# Patient Record
Sex: Female | Born: 1940 | Race: White | Hispanic: No | Marital: Married | Smoking: Never smoker
Health system: Southern US, Community
[De-identification: ages and names within clinical notes are randomized; demographics above are authoritative.]

## PROBLEM LIST (undated history)

## (undated) DIAGNOSIS — J302 Other seasonal allergic rhinitis: Secondary | ICD-10-CM

---

## 1999-06-02 ENCOUNTER — Encounter: Payer: Self-pay | Admitting: Obstetrics and Gynecology

## 1999-06-02 ENCOUNTER — Encounter: Admission: RE | Admit: 1999-06-02 | Discharge: 1999-06-02 | Payer: Self-pay | Admitting: Obstetrics and Gynecology

## 2000-04-13 ENCOUNTER — Ambulatory Visit (HOSPITAL_COMMUNITY): Admission: RE | Admit: 2000-04-13 | Discharge: 2000-04-13 | Payer: Self-pay | Admitting: Gastroenterology

## 2000-04-13 ENCOUNTER — Encounter (INDEPENDENT_AMBULATORY_CARE_PROVIDER_SITE_OTHER): Payer: Self-pay | Admitting: *Deleted

## 2000-08-01 ENCOUNTER — Encounter: Payer: Self-pay | Admitting: Obstetrics and Gynecology

## 2000-08-01 ENCOUNTER — Encounter: Admission: RE | Admit: 2000-08-01 | Discharge: 2000-08-01 | Payer: Self-pay | Admitting: Obstetrics and Gynecology

## 2002-04-10 ENCOUNTER — Encounter: Payer: Self-pay | Admitting: Obstetrics and Gynecology

## 2002-04-10 ENCOUNTER — Encounter: Admission: RE | Admit: 2002-04-10 | Discharge: 2002-04-10 | Payer: Self-pay | Admitting: Obstetrics and Gynecology

## 2003-05-12 ENCOUNTER — Encounter: Admission: RE | Admit: 2003-05-12 | Discharge: 2003-05-12 | Payer: Self-pay | Admitting: Internal Medicine

## 2004-05-04 ENCOUNTER — Other Ambulatory Visit: Admission: RE | Admit: 2004-05-04 | Discharge: 2004-05-04 | Payer: Self-pay | Admitting: Obstetrics and Gynecology

## 2004-06-21 ENCOUNTER — Encounter: Admission: RE | Admit: 2004-06-21 | Discharge: 2004-06-21 | Payer: Self-pay | Admitting: Internal Medicine

## 2005-05-10 ENCOUNTER — Other Ambulatory Visit: Admission: RE | Admit: 2005-05-10 | Discharge: 2005-05-10 | Payer: Self-pay | Admitting: Obstetrics and Gynecology

## 2006-04-24 ENCOUNTER — Encounter: Admission: RE | Admit: 2006-04-24 | Discharge: 2006-04-24 | Payer: Self-pay | Admitting: Obstetrics and Gynecology

## 2006-05-14 ENCOUNTER — Other Ambulatory Visit: Admission: RE | Admit: 2006-05-14 | Discharge: 2006-05-14 | Payer: Self-pay | Admitting: Obstetrics and Gynecology

## 2007-05-07 ENCOUNTER — Encounter: Admission: RE | Admit: 2007-05-07 | Discharge: 2007-05-07 | Payer: Self-pay | Admitting: Obstetrics and Gynecology

## 2008-05-07 ENCOUNTER — Encounter: Admission: RE | Admit: 2008-05-07 | Discharge: 2008-05-07 | Payer: Self-pay | Admitting: Obstetrics and Gynecology

## 2008-05-15 ENCOUNTER — Other Ambulatory Visit: Admission: RE | Admit: 2008-05-15 | Discharge: 2008-05-15 | Payer: Self-pay | Admitting: Obstetrics and Gynecology

## 2009-06-04 ENCOUNTER — Encounter: Admission: RE | Admit: 2009-06-04 | Discharge: 2009-06-04 | Payer: Self-pay | Admitting: Obstetrics and Gynecology

## 2009-06-09 ENCOUNTER — Other Ambulatory Visit: Admission: RE | Admit: 2009-06-09 | Discharge: 2009-06-09 | Payer: Self-pay | Admitting: Obstetrics and Gynecology

## 2010-09-20 ENCOUNTER — Other Ambulatory Visit: Payer: Self-pay | Admitting: Obstetrics and Gynecology

## 2010-09-20 DIAGNOSIS — Z1231 Encounter for screening mammogram for malignant neoplasm of breast: Secondary | ICD-10-CM

## 2010-09-28 ENCOUNTER — Ambulatory Visit
Admission: RE | Admit: 2010-09-28 | Discharge: 2010-09-28 | Disposition: A | Discharge status: Home / Self Care | Payer: Medicare Other | Source: Ambulatory Visit | Attending: Obstetrics and Gynecology | Admitting: Obstetrics and Gynecology

## 2010-09-28 DIAGNOSIS — Z1231 Encounter for screening mammogram for malignant neoplasm of breast: Secondary | ICD-10-CM

## 2010-10-14 NOTE — Procedures (Signed)
[place]  Patient:    Heather Phillips, Heather Phillips                        MRN: [id number] Proc. Date: 04/13/00 Adm. Date:  [id number] Attending:  Nelda Marseille CC:         Lennon Alstrom. Felipa Eth, M.D.   Procedure Report  PROCEDURE:  Colonoscopy with hot biopsy.  INDICATION:  Patient with a history of colon polyps, overdue for colonic screening.  Consent was signed after risks, benefits, methods, and options thoroughly discussed.  MEDICATIONS:  Demerol 65 mg, Versed 6 mg.  DESCRIPTION OF PROCEDURE:  Rectal inspection was pertinent for external hemorrhoids, small.  Digital exam was negative.  Pediatric video colonoscope was inserted and fairly easily despite a tortuous colon able to advance around the colon to the cecum.  This did require some left lower quadrant pressure but no position changes.  On insertion, some left-sided diverticula were seen. The cecum was identified by the appendiceal orifice and the ileocecal valve. In fact, the scope was inserted a short way into the terminal ileum, which was normal.  Photo documentation was obtained.  The scope was slowly withdrawn. The prep was adequate.  The scope was slowly withdrawn.  There was minimal liquid stool that required washing and suctioning.  On slow withdrawal, the cecum, ascending, and transverse were normal.  The scope was drawn around the left side of the colon, where left-sided diverticula were seen.  In the proximal sigmoid, a 2 mm polyp was seen and was cold and then hot biopsied x 1.  No other abnormalities were seen as we slowly withdrew back to the rectum.  Once back to the rectum, the scope was retroflexed, pertinent for some internal hemorrhoids.  Anorectal pullthrough confirmed the above.  The scope was reinserted a short way up the sigmoid, air was suctioned, and the scope removed.  The patient tolerated the procedure well.  There was no obvious immediate complication.  ENDOSCOPIC  DIAGNOSES: 1. Small internal-external hemorrhoids with an anal papilla. 2. Left diverticula and tortuosity. 3. Proximal sigmoid small polyp, cold and then hot biopsied. 4. Otherwise within normal limits to the terminal ileum.  PLAN:  Await pathology but probably recheck in five years.  One week customary postpolypectomy instructions.  I would be happy to see back p.r.n., otherwise return care to Dr. Felipa Eth for the customary health care maintenance to include yearly rectals and guaiacs. DD:  04/13/00 TD:  04/13/00 Job: [id number]

## 2011-05-15 ENCOUNTER — Other Ambulatory Visit: Payer: Self-pay | Admitting: Internal Medicine

## 2011-05-15 DIAGNOSIS — R140 Abdominal distension (gaseous): Secondary | ICD-10-CM

## 2011-05-16 ENCOUNTER — Ambulatory Visit
Admission: RE | Admit: 2011-05-16 | Discharge: 2011-05-16 | Disposition: A | Discharge status: Home / Self Care | Payer: Medicare Other | Source: Ambulatory Visit | Attending: Internal Medicine | Admitting: Internal Medicine

## 2011-05-16 DIAGNOSIS — R140 Abdominal distension (gaseous): Secondary | ICD-10-CM

## 2011-07-25 ENCOUNTER — Other Ambulatory Visit: Payer: Self-pay | Admitting: Internal Medicine

## 2011-07-25 DIAGNOSIS — N281 Cyst of kidney, acquired: Secondary | ICD-10-CM

## 2011-09-08 ENCOUNTER — Other Ambulatory Visit: Payer: Self-pay | Admitting: Obstetrics and Gynecology

## 2011-09-08 ENCOUNTER — Other Ambulatory Visit (HOSPITAL_COMMUNITY)
Admission: RE | Admit: 2011-09-08 | Discharge: 2011-09-08 | Disposition: A | Discharge status: Home / Self Care | Payer: Medicare Other | Source: Ambulatory Visit | Attending: Obstetrics and Gynecology | Admitting: Obstetrics and Gynecology

## 2011-09-08 DIAGNOSIS — Z124 Encounter for screening for malignant neoplasm of cervix: Secondary | ICD-10-CM | POA: Insufficient documentation

## 2011-10-25 ENCOUNTER — Other Ambulatory Visit: Payer: Self-pay | Admitting: Obstetrics and Gynecology

## 2011-10-25 DIAGNOSIS — Z1231 Encounter for screening mammogram for malignant neoplasm of breast: Secondary | ICD-10-CM

## 2011-11-08 ENCOUNTER — Ambulatory Visit: Payer: Medicare Other

## 2011-11-13 ENCOUNTER — Ambulatory Visit
Admission: RE | Admit: 2011-11-13 | Discharge: 2011-11-13 | Disposition: A | Discharge status: Home / Self Care | Payer: Medicare Other | Source: Ambulatory Visit | Attending: Obstetrics and Gynecology | Admitting: Obstetrics and Gynecology

## 2011-11-13 DIAGNOSIS — Z1231 Encounter for screening mammogram for malignant neoplasm of breast: Secondary | ICD-10-CM

## 2012-01-16 ENCOUNTER — Other Ambulatory Visit: Payer: Medicare Other

## 2012-02-20 ENCOUNTER — Ambulatory Visit
Admission: RE | Admit: 2012-02-20 | Discharge: 2012-02-20 | Disposition: A | Discharge status: Home / Self Care | Payer: Medicare Other | Source: Ambulatory Visit | Attending: Internal Medicine | Admitting: Internal Medicine

## 2012-02-20 DIAGNOSIS — N281 Cyst of kidney, acquired: Secondary | ICD-10-CM

## 2012-06-13 ENCOUNTER — Other Ambulatory Visit: Payer: Self-pay | Admitting: Gastroenterology

## 2013-07-10 ENCOUNTER — Other Ambulatory Visit: Payer: Self-pay

## 2013-07-10 DIAGNOSIS — Z1231 Encounter for screening mammogram for malignant neoplasm of breast: Secondary | ICD-10-CM

## 2013-07-30 ENCOUNTER — Ambulatory Visit
Admission: RE | Admit: 2013-07-30 | Discharge: 2013-07-30 | Disposition: A | Discharge status: Home / Self Care | Payer: Self-pay | Source: Ambulatory Visit

## 2013-07-30 DIAGNOSIS — Z1231 Encounter for screening mammogram for malignant neoplasm of breast: Secondary | ICD-10-CM

## 2014-09-15 ENCOUNTER — Other Ambulatory Visit: Payer: Self-pay

## 2014-09-15 DIAGNOSIS — Z1231 Encounter for screening mammogram for malignant neoplasm of breast: Secondary | ICD-10-CM

## 2014-09-19 ENCOUNTER — Emergency Department (HOSPITAL_COMMUNITY)
Admission: EM | Admit: 2014-09-19 | Discharge: 2014-09-19 | Disposition: A | Discharge status: Home / Self Care | Payer: Medicare Other | Attending: Emergency Medicine | Admitting: Emergency Medicine

## 2014-09-19 ENCOUNTER — Encounter (HOSPITAL_COMMUNITY): Payer: Self-pay | Admitting: Nurse Practitioner

## 2014-09-19 ENCOUNTER — Emergency Department (HOSPITAL_COMMUNITY): Payer: Medicare Other

## 2014-09-19 DIAGNOSIS — Z7952 Long term (current) use of systemic steroids: Secondary | ICD-10-CM | POA: Diagnosis not present

## 2014-09-19 DIAGNOSIS — Z79899 Other long term (current) drug therapy: Secondary | ICD-10-CM | POA: Insufficient documentation

## 2014-09-19 DIAGNOSIS — M25569 Pain in unspecified knee: Secondary | ICD-10-CM

## 2014-09-19 DIAGNOSIS — M25562 Pain in left knee: Secondary | ICD-10-CM | POA: Insufficient documentation

## 2014-09-19 DIAGNOSIS — Z880 Allergy status to penicillin: Secondary | ICD-10-CM | POA: Insufficient documentation

## 2014-09-19 DIAGNOSIS — M79662 Pain in left lower leg: Secondary | ICD-10-CM | POA: Diagnosis present

## 2014-09-19 HISTORY — DX: Other seasonal allergic rhinitis: J30.2

## 2014-09-19 MED ORDER — HYDROCODONE-ACETAMINOPHEN 5-325 MG PO TABS: 1.0000 | ORAL_TABLET | Freq: Four times a day (QID) | ORAL | Status: DC | PRN

## 2014-09-19 NOTE — ED Notes (Signed)
Patient has ice to calf, patient states ice relieves the pain. Patient rates pain 2/10 while witting but 10/10 when ambulating.

## 2014-09-19 NOTE — Discharge Instructions (Signed)
Follow-up with orthopedics or your primary care doctor. Keep your knee elevated, continue to ice it, alternate ibuprofen and Tylenol as needed for pain every 3-4 hours. You may also use Vicodin as needed for pain. Return to the ER if any severe swelling, redness, warmth, difficulty moving her knee, high fever, numbness, weakness.  Knee Pain The knee is the complex joint between your thigh and your lower leg. It is made up of bones, tendons, ligaments, and cartilage. The bones that make up the knee are:  The femur in the thigh.  The tibia and fibula in the lower leg.  The patella or kneecap riding in the groove on the lower femur. CAUSES  Knee pain is a common complaint with many causes. A few of these causes are:  Injury, such as:  A ruptured ligament or tendon injury.  Torn cartilage.  Medical conditions, such as:  Gout  Arthritis  Infections  Overuse, over training, or overdoing a physical activity. Knee pain can be minor or severe. Knee pain can accompany debilitating injury. Minor knee problems often respond well to self-care measures or get well on their own. More serious injuries may need medical intervention or even surgery. SYMPTOMS The knee is complex. Symptoms of knee problems can vary widely. Some of the problems are:  Pain with movement and weight bearing.  Swelling and tenderness.  Buckling of the knee.  Inability to straighten or extend your knee.  Your knee locks and you cannot straighten it.  Warmth and redness with pain and fever.  Deformity or dislocation of the kneecap. DIAGNOSIS  Determining what is wrong may be very straight forward such as when there is an injury. It can also be challenging because of the complexity of the knee. Tests to make a diagnosis may include:  Your caregiver taking a history and doing a physical exam.  Routine X-rays can be used to rule out other problems. X-rays will not reveal a cartilage tear. Some injuries of the  knee can be diagnosed by:  Arthroscopy a surgical technique by which a small video camera is inserted through tiny incisions on the sides of the knee. This procedure is used to examine and repair internal knee joint problems. Tiny instruments can be used during arthroscopy to repair the torn knee cartilage (meniscus).  Arthrography is a radiology technique. A contrast liquid is directly injected into the knee joint. Internal structures of the knee joint then become visible on X-ray film.  An MRI scan is a non X-ray radiology procedure in which magnetic fields and a computer produce two- or three-dimensional images of the inside of the knee. Cartilage tears are often visible using an MRI scanner. MRI scans have largely replaced arthrography in diagnosing cartilage tears of the knee.  Blood work.  Examination of the fluid that helps to lubricate the knee joint (synovial fluid). This is done by taking a sample out using a needle and a syringe. TREATMENT The treatment of knee problems depends on the cause. Some of these treatments are:  Depending on the injury, proper casting, splinting, surgery, or physical therapy care will be needed.  Give yourself adequate recovery time. Do not overuse your joints. If you begin to get sore during workout routines, back off. Slow down or do fewer repetitions.  For repetitive activities such as cycling or running, maintain your strength and nutrition.  Alternate muscle groups. For example, if you are a weight lifter, work the upper body on one day and the lower body the  next.  Either tight or weak muscles do not give the proper support for your knee. Tight or weak muscles do not absorb the stress placed on the knee joint. Keep the muscles surrounding the knee strong.  Take care of mechanical problems.  If you have flat feet, orthotics or special shoes may help. See your caregiver if you need help.  Arch supports, sometimes with wedges on the inner or outer  aspect of the heel, can help. These can shift pressure away from the side of the knee most bothered by osteoarthritis.  A brace called an "unloader" brace also may be used to help ease the pressure on the most arthritic side of the knee.  If your caregiver has prescribed crutches, braces, wraps or ice, use as directed. The acronym for this is PRICE. This means protection, rest, ice, compression, and elevation.  Nonsteroidal anti-inflammatory drugs (NSAIDs), can help relieve pain. But if taken immediately after an injury, they may actually increase swelling. Take NSAIDs with food in your stomach. Stop them if you develop stomach problems. Do not take these if you have a history of ulcers, stomach pain, or bleeding from the bowel. Do not take without your caregiver's approval if you have problems with fluid retention, heart failure, or kidney problems.  For ongoing knee problems, physical therapy may be helpful.  Glucosamine and chondroitin are over-the-counter dietary supplements. Both may help relieve the pain of osteoarthritis in the knee. These medicines are different from the usual anti-inflammatory drugs. Glucosamine may decrease the rate of cartilage destruction.  Injections of a corticosteroid drug into your knee joint may help reduce the symptoms of an arthritis flare-up. They may provide pain relief that lasts a few months. You may have to wait a few months between injections. The injections do have a small increased risk of infection, water retention, and elevated blood sugar levels.  Hyaluronic acid injected into damaged joints may ease pain and provide lubrication. These injections may work by reducing inflammation. A series of shots may give relief for as long as 6 months.  Topical painkillers. Applying certain ointments to your skin may help relieve the pain and stiffness of osteoarthritis. Ask your pharmacist for suggestions. Many over the-counter products are approved for temporary  relief of arthritis pain.  In some countries, doctors often prescribe topical NSAIDs for relief of chronic conditions such as arthritis and tendinitis. A review of treatment with NSAID creams found that they worked as well as oral medications but without the serious side effects. PREVENTION  Maintain a healthy weight. Extra pounds put more strain on your joints.  Get strong, stay limber. Weak muscles are a common cause of knee injuries. Stretching is important. Include flexibility exercises in your workouts.  Be smart about exercise. If you have osteoarthritis, chronic knee pain or recurring injuries, you may need to change the way you exercise. This does not mean you have to stop being active. If your knees ache after jogging or playing basketball, consider switching to swimming, water aerobics, or other low-impact activities, at least for a few days a week. Sometimes limiting high-impact activities will provide relief.  Make sure your shoes fit well. Choose footwear that is right for your sport.  Protect your knees. Use the proper gear for knee-sensitive activities. Use kneepads when playing volleyball or laying carpet. Buckle your seat belt every time you drive. Most shattered kneecaps occur in car accidents.  Rest when you are tired. SEEK MEDICAL CARE IF:  You have knee pain  that is continual and does not seem to be getting better.  SEEK IMMEDIATE MEDICAL CARE IF:  Your knee joint feels hot to the touch and you have a high fever. MAKE SURE YOU:   Understand these instructions.  Will watch your condition.  Will get help right away if you are not doing well or get worse. Document Released: 03/12/2007 Document Revised: 08/07/2011 Document Reviewed: 03/12/2007 [facility] Patient Information 2015 [place], [place]. This information is not intended to replace advice given to you by your health care provider. Make sure you discuss any questions you have with your health care provider.

## 2014-09-19 NOTE — ED Notes (Signed)
Declined W/C at D/C and was escorted to lobby by RN. 

## 2014-09-19 NOTE — ED Notes (Signed)
Pulled prescription  pad for PA Ladona MowJoe Mintz

## 2014-09-19 NOTE — Progress Notes (Signed)
VASCULAR LAB PRELIMINARY  PRELIMINARY  PRELIMINARY  PRELIMINARY  Left lower extremity venous Doppler completed.    Preliminary report:  There is no DVT, SVT, or Baker's cyst noted in the left lower extremity.   Kalisi Bevill, RVT 09/19/2014, 2:39 PM

## 2014-09-19 NOTE — ED Provider Notes (Signed)
Patient presented to the ER with leg pain. Patient experiencing pain behind the left knee and left upper calf. Pain significantly worsens if she stands or walks. She denies injury.  Face to face Exam: HEENT - PERRLA Lungs - CTAB Heart - RRR, no M/R/G Abd - S/NT/ND Neuro - alert, oriented x3 Musculoskeletal - no calf tenderness, no Homans sign, no venous cords. Normal dorsalis pedal pulse. Tenderness in the popliteal fossa without mass or sign of aneurysm. Skin - no erythema or skin changes  Plan: Agent with pain primarily behind the left knee but there is some tenderness in the left upper calf without any obvious signs of DVT. Will x-ray the knee and obtained venous duplex, likely secondary to strain, osteoarthritis or possibly Baker's cyst.  Gilda Creasehristopher J Pollina, MD 09/19/14 1428

## 2014-09-19 NOTE — ED Notes (Signed)
She reports onset of L posterior leg pain after moving from sitting to standing position. Every time she stands and bears weight she has severe pain. The pain resolves with rest. She denies any injuries. She applied ice with no relief. Cms intact

## 2014-09-19 NOTE — ED Provider Notes (Signed)
CSN: [id number]     Arrival date & time 09/19/14  1259 History   This chart was scribed for non-physician practitioner, Jinny Sanders, PA-C working with Gilda Crease, MD, by Abel Presto, ED Scribe. This patient was seen in room TR08C/TR08C and the patient's care was started at 1:18 PM.      Chief Complaint  Patient presents with  . Leg Pain    Patient is a 74 y.o. female presenting with leg pain. The history is provided by the patient. No language interpreter was used.  Leg Pain  HPI Comments: Shanica Castellanos is a 74 y.o. female who presents to the Emergency Department complaining of left posterior knee pain with onset this morning. Pt states pain begun while she was at a conference. She reports pain with bearing weight and ambulation, causing her to walk with a limp. She states some pain with ROM.  Pt has had similar pain in the past but states current pain is much worse. She states she typically takes ASA for relief but notes she forgot to do so today. Pt has tried icing for relief. Pt denies any known injury, numbness, and weakness.   Past Medical History  Diagnosis Date  . Seasonal allergies    History reviewed. No pertinent past surgical history. History reviewed. No pertinent family history. History  Substance Use Topics  . Smoking status: Never Smoker   . Smokeless tobacco: Not on file  . Alcohol Use: No   OB History    No data available     Review of Systems  Musculoskeletal: Positive for myalgias.  Neurological: Negative for weakness and numbness.      Allergies  Celebrex; Penicillins; and Sulfur  Home Medications   Prior to Admission medications   Medication Sig Start Date End Date Taking? Authorizing Provider  Apoaequorin (PREVAGEN PO) Take 1 capsule by mouth daily.   Yes Historical Provider, MD  cholecalciferol (VITAMIN D) 1000 UNITS tablet Take 1,000 Units by mouth daily.   Yes Historical Provider, MD  fluticasone (FLONASE) 50 MCG/ACT nasal spray  Place 2 sprays into both nostrils daily.   Yes Historical Provider, MD  loratadine-pseudoephedrine (CLARITIN-D 24-HOUR) 10-240 MG per 24 hr tablet Take 1 tablet by mouth daily.   Yes Historical Provider, MD  montelukast (SINGULAIR) 10 MG tablet Take 10 mg by mouth at bedtime.   Yes Historical Provider, MD  vitamin C (ASCORBIC ACID) 250 MG tablet Take 250 mg by mouth daily.   Yes Historical Provider, MD  HYDROcodone-acetaminophen (NORCO/VICODIN) 5-325 MG per tablet Take 1-2 tablets by mouth every 6 (six) hours as needed. 09/19/14   Ladona Mow, PA-C   BP 109/68 mmHg  Pulse 76  Temp(Src) 98.5 F (36.9 C) (Oral)  Resp 18  SpO2 100% Physical Exam  Constitutional: She is oriented to person, place, and time. She appears well-developed and well-nourished.  HENT:  Head: Normocephalic.  Eyes: Conjunctivae are normal.  Neck: Normal range of motion. Neck supple.  Cardiovascular:  Pulses:      Dorsalis pedis pulses are 2+ on the right side, and 2+ on the left side.  DP and popliteal pulses intact  Pulmonary/Chest: Effort normal.  Musculoskeletal: Normal range of motion.       Left knee: She exhibits normal range of motion, no effusion and no erythema.  Left leg: no erythema, edema or warmth. No obvious effusion; full ROM of knee. Small mild amount of swelling posterior aspect of knee 5/5 strength  Neurological: She is alert and oriented  to person, place, and time.  Skin: Skin is warm and dry.  Psychiatric: She has a normal mood and affect. Her behavior is normal.  Nursing note and vitals reviewed.   ED Course  Procedures (including critical care time) DIAGNOSTIC STUDIES: Oxygen Saturation is 99% on room air, normal by my interpretation.    COORDINATION OF CARE: 1:27 PM Discussed treatment plan with patient at beside, the patient agrees with the plan and has no further questions at this time.   Labs Review Labs Reviewed - No data to display  Imaging Review Dg Knee Complete 4 Views  Left  09/19/2014   CLINICAL DATA:  Posterior LEFT knee pain. Acute onset. Heaviness in the posterior knee. Inability to bear weight on the LEFT knee.  EXAM: LEFT KNEE - COMPLETE 4+ VIEW  COMPARISON:  None.  FINDINGS: No fracture or acute osseous abnormality. No large effusion. Mild patellofemoral and lateral compartment osteoarthritis. Central osteophyte is present in the weight-bearing lateral femoral condyle articular surface. Minimal medial compartment osteoarthritis.  The alignment of the knee is anatomic.  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   Electronically Signed   By: Andreas NewportGeoffrey  Lamke M.D.   On: 09/19/2014 14:39     EKG Interpretation None      MDM   Final diagnoses:  Knee pain  Knee pain, acute, left   Patient here with posterior knee pain, which is worse today than usual. Patient states she typically has this pain, was worse today after standing up abruptly. Patient describing pain with range of motion. On exam there is no acute abnormalities. There is no calf tenderness, Homans sign negative. Mild tenderness noted to popliteal region without signs of DVT, mass or aneurysm. Discomfort patient is experiencing is likely musculoskeletal in nature. No concern for septic arthritis.  Based on patient's age and vague complaint, will follow with radiographs and with venous duplex of her lower extremity.  X-rays remarkable for some tricompartmental degenerative changes. Venous duplex without evidence of DVT, SVT or Baker's cyst.  Patient hemodynamically stable and in no acute distress. Patient will be discharged at this time. RICE therapy encouraged, patient given knee immobilizer and prescription for walker to use at home. Strongly encouraged patient to follow up with orthopedics or her primary care physician. Discussed return precautions with patient, patient and her husband verbalize understanding and agreement of this plan. Encouraged patient to  call or return to the ER with any worsening symptoms or should she have any questions or concerns.  I personally performed the services described in this documentation, which was scribed in my presence. The recorded information has been reviewed and is accurate.  BP 109/68 mmHg  Pulse 76  Temp(Src) 98.5 F (36.9 C) (Oral)  Resp 18  SpO2 100%  Signed,  Ladona MowJoe Kamariah Fruchter, PA-C 5:15 PM  Patient seen and discussed with Dr. Jaci Carrelhristopher Pollina, M.D.     Ladona MowJoe Diandra Cimini, PA-C 09/19/14 1715  Gilda Creasehristopher J Pollina, MD 09/20/14 [phone]

## 2014-09-25 ENCOUNTER — Ambulatory Visit: Payer: Self-pay

## 2014-10-12 ENCOUNTER — Ambulatory Visit
Admission: RE | Admit: 2014-10-12 | Discharge: 2014-10-12 | Disposition: A | Discharge status: Home / Self Care | Payer: Medicare Other | Source: Ambulatory Visit

## 2014-10-12 DIAGNOSIS — Z1231 Encounter for screening mammogram for malignant neoplasm of breast: Secondary | ICD-10-CM

## 2014-11-27 ENCOUNTER — Other Ambulatory Visit: Payer: Self-pay | Admitting: Orthopedic Surgery

## 2014-11-27 DIAGNOSIS — M25561 Pain in right knee: Secondary | ICD-10-CM

## 2014-12-15 ENCOUNTER — Other Ambulatory Visit: Payer: Self-pay | Admitting: Orthopedic Surgery

## 2014-12-15 ENCOUNTER — Ambulatory Visit
Admission: RE | Admit: 2014-12-15 | Discharge: 2014-12-15 | Disposition: A | Discharge status: Home / Self Care | Payer: Medicare Other | Source: Ambulatory Visit | Attending: Orthopedic Surgery | Admitting: Orthopedic Surgery

## 2014-12-15 DIAGNOSIS — M25561 Pain in right knee: Secondary | ICD-10-CM

## 2014-12-16 ENCOUNTER — Other Ambulatory Visit: Payer: Self-pay | Admitting: Orthopedic Surgery

## 2014-12-16 DIAGNOSIS — M25562 Pain in left knee: Secondary | ICD-10-CM

## 2015-03-03 ENCOUNTER — Ambulatory Visit (INDEPENDENT_AMBULATORY_CARE_PROVIDER_SITE_OTHER): Payer: Medicare Other | Admitting: Sports Medicine

## 2015-03-03 ENCOUNTER — Encounter: Payer: Self-pay | Admitting: Sports Medicine

## 2015-03-03 VITALS — Ht 61.5 in | Wt 135.0 lb

## 2015-03-03 DIAGNOSIS — M1711 Unilateral primary osteoarthritis, right knee: Secondary | ICD-10-CM | POA: Diagnosis not present

## 2015-03-03 NOTE — Progress Notes (Signed)
Heather Phillips - 75 y.o. female MRN [id number]  Date of birth: Sep 17, 1940  CC: Left knee pain  SUBJECTIVE:   HPI  Acute onset of Left knee pain on 09/19/2014: - Acute injury. It worsened through the day. No twisting just began hurting suddenly - Patient was seen at [facility] ortho by Dr. August Saucer in the ensuing few weeks.  -- 2 aspirations and cortisone prior to trip to [place] -- aspirations/cortisone injections did help her through her vacation. - Currently constant 2-3/10 pain, unless she overdoes it.   - Going down stairs currently hurts her the most. Feels more unstable.  No frank giving out.  - NO locking or swelling over the last few months.   - Takes IBU rarely, currently <1/week -  Currently doing spinning and some exercise machines at the [facility]. Wonders what she can and can't do.  - She did hike this  point RoS negative other than that listed in HPI in regards to orthopedic complaint.   HISTORY: Past Medical, Surgical, Social, and Family History Reviewed & Updated per EMR.  Pertinent Historical finding: no   OBJECTIVE: Ht 5' 1.5" (1.562 m)  Wt 135 lb (61.236 kg)  BMI 25.10 kg/m2  Physical Exam  Calm, no distress Non-labored breathing.   Knee:  left Normal to inspection with no erythema or effusion or obvious bony abnormalities. Tender posteriomedial joint line.  Complete ROM normal in flexion and extension and lower leg rotation. Pain with terminal flexion.   Ligaments with solid consistent endpoints including ACL, PCL, LCL, MCL. Negative Mcmurray's and provocative meniscal tests, including thessaly Non painful patellar compression. Patellar and quadriceps tendons unremarkable. Hamstring and quadriceps strength is normal.  Imaging: [place] image of the left knee in both long and short axis obtained. There is a mild effusion in the suprapatellar pouch.  QT & PT intact and with a normal fibrillar appearance. Left LM does have a small linear hypoechoic disruption with minimal surrounding fluid. The left MM shows significant surrounding fluid, several hypoechoic lines, and buldging out of the medial joint space.  These findings, in addition to her exam and MRI suggest degenerative changes.  The effusion does suggest intraarticular activity.    MEDICATIONS, LABS & OTHER ORDERS: Previous Medications   APOAEQUORIN (PREVAGEN PO)    Take 1 capsule by mouth daily.   CHOLECALCIFEROL (VITAMIN D) 1000 UNITS TABLET    Take 1,000 Units by mouth daily.   FLUTICASONE (FLONASE) 50 MCG/ACT NASAL SPRAY    Place 2 sprays into both nostrils daily.   HYDROCODONE-ACETAMINOPHEN (NORCO/VICODIN) 5-325 MG PER TABLET    Take 1-2 tablets by mouth every 6 (six) hours as needed.   IBUPROFEN (ADVIL,MOTRIN) 200 MG TABLET    Take 200 mg by mouth every 6 (six) hours as needed.  LORATADINE-PSEUDOEPHEDRINE (CLARITIN-D 24-HOUR) 10-240 MG PER 24 HR TABLET    Take 1 tablet by mouth daily.   MONTELUKAST (SINGULAIR) 10 MG TABLET    Take 10 mg by mouth at bedtime.   VITAMIN C (ASCORBIC ACID) 250 MG TABLET    Take 250 mg by mouth daily.   Modified Medications   No medications on file   New Prescriptions   No medications on file   Discontinued Medications   No medications  on file  No orders of the defined types were placed in this encounter.   ASSESSMENT & PLAN: See problem based charting & AVS for pt instructions.

## 2015-03-04 DIAGNOSIS — M1712 Unilateral primary osteoarthritis, left knee: Secondary | ICD-10-CM | POA: Insufficient documentation

## 2015-03-04 NOTE — Assessment & Plan Note (Addendum)
Right knee DJD, exacerbated in 08/2014.  This was visualized with MRI in 11/2014 and today with u/s.  She has a right posterior medial horn tear. Doing much better now and is able to go on hikes.  NO locking, catching, or giving away.   - Discussed best exercises for knees.   - Continue PRN NSAIDs, which she rarely needs.  - She will enroll in the OA study which will determine if she'll be wearing a brace.  - f/u next week to meet with PT from [facility].

## 2015-09-06 ENCOUNTER — Encounter: Payer: Self-pay | Admitting: Sports Medicine

## 2015-09-06 ENCOUNTER — Ambulatory Visit (INDEPENDENT_AMBULATORY_CARE_PROVIDER_SITE_OTHER): Payer: Medicare Other | Admitting: Sports Medicine

## 2015-09-06 VITALS — BP 139/74 | HR 76 | Ht 61.0 in | Wt 135.0 lb

## 2015-09-06 DIAGNOSIS — M25561 Pain in right knee: Secondary | ICD-10-CM | POA: Diagnosis not present

## 2015-09-06 DIAGNOSIS — M1712 Unilateral primary osteoarthritis, left knee: Secondary | ICD-10-CM | POA: Diagnosis not present

## 2015-09-06 NOTE — Progress Notes (Signed)
Patient ID: Heather Phillips, female   DOB: 11/09/1940, 75 y.o.   MRN: [id number]  CC: Patient returns with 2 weeks of right knee pain  She has MRI documented tricompartmental arthritis of the left knee She responded exceptionally well to the home PT program from the knee study Also uses her knee compression sleeve This reduced her pain to 0 to 1 on most days Has resumed a walking program  Now with 2 weeks of RT knee pain Had started a more vigorous amount of walking Pain hurts now at night as well as with going up or down stairs  Past Hx Never smoked  ROS No swelling noted No locking or giving way  Exam WD, WN WF in NAD BP 139/74 mmHg  Pulse 76  Ht 5\' 1"  (1.549 m)  Wt 135 lb (61.236 kg)  BMI 25.52 kg/m2  Left knee with no obvious effusion Now with good motion - full extension and 135 of flexion  Rt knee with no significant effusion Good motion Ligaments stable Some mild PF pain with motion and compression  US RT knee No significant effusion Some degenerative changes at meniscus medial but not much swelling Tendons intact  Note left knee shows much smaller effusion

## 2015-10-03 DIAGNOSIS — M25561 Pain in right knee: Secondary | ICD-10-CM | POA: Insufficient documentation

## 2015-10-03 NOTE — Assessment & Plan Note (Signed)
Reassured that this knee looks better  Will have her use compression  HEP as per PT training  Reck 2 mos prn

## 2015-10-03 NOTE — Assessment & Plan Note (Signed)
She has done well with HEP Compression as needed  Cont this

## 2015-11-16 ENCOUNTER — Encounter: Payer: Self-pay | Admitting: Sports Medicine

## 2015-11-16 ENCOUNTER — Ambulatory Visit (INDEPENDENT_AMBULATORY_CARE_PROVIDER_SITE_OTHER): Payer: Medicare Other | Admitting: Sports Medicine

## 2015-11-16 VITALS — BP 129/91 | HR 84 | Ht 61.0 in | Wt 135.0 lb

## 2015-11-16 DIAGNOSIS — M25561 Pain in right knee: Secondary | ICD-10-CM

## 2015-11-16 DIAGNOSIS — M1712 Unilateral primary osteoarthritis, left knee: Secondary | ICD-10-CM

## 2015-11-16 NOTE — Assessment & Plan Note (Signed)
Response was excellent to HEP Less changes of OA Excellent ROM  Prn reck

## 2015-11-16 NOTE — Progress Notes (Signed)
Patient ID: Heather Phillips, female   DOB: 11/09/1940, 75 y.o.   MRN: [id number]  F/u of RT knee OA/ Pain  Patient followed protocol for HEP and compression Stated pain is 95% resolved On bad days only 1 or 2 level No more swelling Able to walk and active on recent trip  Lt Knee Known significant OA on MRI Continues to do well  ROS No locking No giving way No other joint swelling  PEXAM  Pleasant in NAD BP 129/91 mmHg  Pulse 84  Ht 5\' 1"  (1.549 m)  Wt 135 lb (61.236 kg)  BMI 25.52 kg/m2  RT Knee: Normal to inspection with no erythema or effusion or obvious bony abnormalities. Palpation normal with no warmth or joint line tenderness or patellar tenderness or condyle tenderness. ROM normal in flexion and extension and lower leg rotation. Note she can get to -2 and 160 deg of knee flexion!  Ligaments with solid consistent endpoints including ACL, PCL, LCL, MCL. Negative Mcmurray's and provocative meniscal tests. Non painful patellar compression. She does have patellar creptitation Patellar and quadriceps tendons unremarkable. Hamstring and quadriceps strength is normal.  LT knee Exam is similar to RT although more visible changes of OA Flexion is still up to 145 to 150 deg and _5 of extension

## 2015-11-16 NOTE — Assessment & Plan Note (Signed)
Cont on HEP and compression program as needed  Reck 1 yr

## 2016-03-23 ENCOUNTER — Other Ambulatory Visit: Payer: Self-pay | Admitting: Internal Medicine

## 2016-03-23 DIAGNOSIS — Z1231 Encounter for screening mammogram for malignant neoplasm of breast: Secondary | ICD-10-CM

## 2016-05-03 ENCOUNTER — Ambulatory Visit
Admission: RE | Admit: 2016-05-03 | Discharge: 2016-05-03 | Disposition: A | Discharge status: Home / Self Care | Payer: Medicare Other | Source: Ambulatory Visit | Attending: Internal Medicine | Admitting: Internal Medicine

## 2016-05-03 DIAGNOSIS — Z1231 Encounter for screening mammogram for malignant neoplasm of breast: Secondary | ICD-10-CM

## 2017-06-11 ENCOUNTER — Other Ambulatory Visit: Payer: Self-pay | Admitting: Internal Medicine

## 2017-06-11 DIAGNOSIS — Z1231 Encounter for screening mammogram for malignant neoplasm of breast: Secondary | ICD-10-CM

## 2017-07-10 ENCOUNTER — Ambulatory Visit
Admission: RE | Admit: 2017-07-10 | Discharge: 2017-07-10 | Disposition: A | Discharge status: Home / Self Care | Payer: Medicare Other | Source: Ambulatory Visit | Attending: Internal Medicine | Admitting: Internal Medicine

## 2017-07-10 DIAGNOSIS — Z1231 Encounter for screening mammogram for malignant neoplasm of breast: Secondary | ICD-10-CM

## 2018-12-03 ENCOUNTER — Other Ambulatory Visit: Payer: Self-pay | Admitting: Obstetrics & Gynecology

## 2018-12-03 ENCOUNTER — Other Ambulatory Visit: Payer: Self-pay | Admitting: Internal Medicine

## 2018-12-03 DIAGNOSIS — Z1231 Encounter for screening mammogram for malignant neoplasm of breast: Secondary | ICD-10-CM

## 2019-02-06 ENCOUNTER — Ambulatory Visit
Admission: RE | Admit: 2019-02-06 | Discharge: 2019-02-06 | Disposition: A | Discharge status: Home / Self Care | Payer: Medicare Other | Source: Ambulatory Visit | Attending: Obstetrics & Gynecology | Admitting: Obstetrics & Gynecology

## 2019-02-06 ENCOUNTER — Ambulatory Visit: Payer: Medicare Other

## 2019-02-06 ENCOUNTER — Other Ambulatory Visit: Payer: Self-pay

## 2019-02-06 DIAGNOSIS — Z1231 Encounter for screening mammogram for malignant neoplasm of breast: Secondary | ICD-10-CM

## 2020-01-22 ENCOUNTER — Other Ambulatory Visit: Payer: Self-pay | Admitting: Obstetrics & Gynecology

## 2020-01-22 DIAGNOSIS — Z1231 Encounter for screening mammogram for malignant neoplasm of breast: Secondary | ICD-10-CM

## 2020-02-17 ENCOUNTER — Ambulatory Visit
Admission: RE | Admit: 2020-02-17 | Discharge: 2020-02-17 | Disposition: A | Discharge status: Home / Self Care | Payer: Medicare Other | Source: Ambulatory Visit | Attending: Obstetrics & Gynecology | Admitting: Obstetrics & Gynecology

## 2020-02-17 ENCOUNTER — Other Ambulatory Visit: Payer: Self-pay

## 2020-02-17 DIAGNOSIS — Z1231 Encounter for screening mammogram for malignant neoplasm of breast: Secondary | ICD-10-CM

## 2021-04-06 ENCOUNTER — Other Ambulatory Visit: Payer: Self-pay | Admitting: Internal Medicine

## 2021-04-06 DIAGNOSIS — Z1231 Encounter for screening mammogram for malignant neoplasm of breast: Secondary | ICD-10-CM

## 2021-05-05 ENCOUNTER — Ambulatory Visit: Payer: Medicare Other | Admitting: Sports Medicine

## 2021-05-05 DIAGNOSIS — M1712 Unilateral primary osteoarthritis, left knee: Secondary | ICD-10-CM | POA: Diagnosis not present

## 2021-05-05 NOTE — Progress Notes (Signed)
Patient is a 80 yr female who presents with returning left knee pain.   States that she feels it coincides with when she stopped HEP w quad exercising around April. States that she has difficulty with her left knee when she gets up after a while and the first few steps tend to cause her pain which gradually improves in about a minute or so. She states that motrin and the exercises did help.    ROS Denies any catching or locking of the left knee. No pain in the right knee.  Pleasant older F BP 116/72   Ht 5' 1.5" (1.562 m)   Wt 130 lb (59 kg)   BMI 24.17 kg/m  No flowsheet data found.  Gait: Antalgic - mildly Left Knee: flexion 130 degrees, extension to 5 degrees. 5/5 strength on extension, 4/5 on flexion.  Right Lower Extremity: Knee flexion 145 degrees, extension to 0 degrees. 5/5 strength on extension and flexion.  Hip abduction strength 5/5 bilaterally

## 2021-05-05 NOTE — Assessment & Plan Note (Addendum)
This has done well for past 5 years She has lost about 10 deg of flexion However, with HEP she has managed to stay at same activity level and had only mild pain  Since pain returned after stopping HEP we restarted  This We tested her to be sure that exercises were not painful Keep these up and use compression sleeve  Reck if not responding

## 2021-06-14 ENCOUNTER — Ambulatory Visit
Admission: RE | Admit: 2021-06-14 | Discharge: 2021-06-14 | Disposition: A | Discharge status: Home / Self Care | Payer: Medicare Other | Source: Ambulatory Visit | Attending: Internal Medicine | Admitting: Internal Medicine

## 2021-06-14 DIAGNOSIS — Z1231 Encounter for screening mammogram for malignant neoplasm of breast: Secondary | ICD-10-CM

## 2021-08-04 ENCOUNTER — Ambulatory Visit: Payer: Medicare Other | Admitting: Sports Medicine

## 2021-08-04 DIAGNOSIS — M1712 Unilateral primary osteoarthritis, left knee: Secondary | ICD-10-CM

## 2021-08-04 MED ORDER — METHYLPREDNISOLONE ACETATE 40 MG/ML IJ SUSP
40.0000 mg | Freq: Once | INTRAMUSCULAR | Status: AC
  Administered 2021-08-04: 15:00:00 40 mg via INTRA_ARTICULAR

## 2021-08-04 NOTE — Patient Instructions (Addendum)
Mrs Heather Phillips, ? ?It was a pleasure seeing you in clinic. Today we discussed:  ? ?Left knee pain: This is due to a spur on the lateral aspect of your knee irritating the tendons.  ?-You did have inflammation in the knee for which you received a steroid injection for this today ?- For the next 2 days, light exercise only; after this, you may start doing straight leg raises and knee extensions as we discussed in office ?- Consider water aerobics or bicycle to offload some weight during exercise from your knees  ?- Information provided for [employer] ? ?Thank you, we look forward to helping you remain healthy! ? ? ? ?

## 2021-08-04 NOTE — Assessment & Plan Note (Signed)
For injection with CS today ?See how she does with pain relief ?Simple HEP ?Try water aerobics ?

## 2021-08-04 NOTE — Progress Notes (Signed)
PCP: Chilton Greathouse, MD ? ?Subjective:  ? ?HPI: ?Patient is a 81 y.o. female here for left knee pain. Patient has a history of bilateral chronic knee pain in setting of degenerative joint disease. This has been relatively well-controlled with physical therapy; however, over the past three weeks, patient endorses worsening left knee pain. This is mostly located on the lateral aspect of her knee and has made it difficult for her to walk and has limited her daily functioning. She has been taking Motrin daily which does provide some relief. She has associated pain at the left buttocks for which she is using massage gun that has provided some relief.  ? ?Past Medical History:  ?Diagnosis Date  ? Seasonal allergies   ? ? ?Current Outpatient Medications on File Prior to Visit  ?Medication Sig Dispense Refill  ? Apoaequorin (PREVAGEN PO) Take 1 capsule by mouth daily.    ? cholecalciferol (VITAMIN D) 1000 UNITS tablet Take 1,000 Units by mouth daily.    ? fluticasone (FLONASE) 50 MCG/ACT nasal spray Place 2 sprays into both nostrils daily.    ? HYDROcodone-acetaminophen (NORCO/VICODIN) 5-325 MG per tablet Take 1-2 tablets by mouth every 6 (six) hours as needed. (Patient not taking: Reported on 03/03/2015) 10 tablet 0  ? ibuprofen (ADVIL,MOTRIN) 200 MG tablet Take 200 mg by mouth every 6 (six) hours as needed.    ? loratadine-pseudoephedrine (CLARITIN-D 24-HOUR) 10-240 MG per 24 hr tablet Take 1 tablet by mouth daily.    ? montelukast (SINGULAIR) 10 MG tablet Take 10 mg by mouth at bedtime.    ? vitamin C (ASCORBIC ACID) 250 MG tablet Take 250 mg by mouth daily.    ? ?No current facility-administered medications on file prior to visit.  ? ? ?No past surgical history on file. ? ?Allergies  ?Allergen Reactions  ? Celebrex [Celecoxib]   ? Elemental Sulfur   ? Penicillins   ? ? ?BP 104/72   Ht 5' 1.5" (1.562 m)   Wt 130 lb (59 kg)   BMI 24.17 kg/m?  ? ?[facility] 08/04/2021  ?Frequency of aerobic  exercise (# of days/week) 0  ?Average time in minutes 0  ?Frequency of strengthening activities (# of days/week) 0  ? ? ?No flowsheet data found. ? ?    ?Objective:  ?Physical Exam: ? ?Gen: NAD, comfortable in exam room ?L Knee: Antalgic gait. No gross deformity but does have some swelling at the suprapatellar region without erythema or bruising noted. No joint line or patellar tenderness to palpation. Does have tenderness to palpation of the lateral tendon of biceps femoris. Limited flexion of knee but does have full ROM with extension of knee and flexion and extension of hip. Strength 5/5 and sensation to light touch intact. Negative anterior/Lachman's test, McMurray's test, varus/valgus  ?L hip: normal ROM, negative FABER and FADIR bilaterally ? ?Ultrasound: ?[facility] ultrasound knee: Images were obtained both in the transverse and longitudinal plane. ?Patellar and quadriceps tendons were well visualized with no abnormalities. ?3-4cm effusion noted in the suprapatellar pouch.  ?Spur noted at the lateral knee.  ?Medial and lateral menisci were well visualized with some hypoechoic change but no clear tears ? ?Impression: mild to moderate arthritis of left knee ? ?Ultrasound and interpretation by Sibyl Parr. Fields, MD ? ? ?Procedure Note:  ?After informed written consent timeout was performed, patient was seated on exam table. Left knee was prepped with alcohol swab and utilizing supralateral approach, patient's left knee was injected intraarticularly into suprapatellar  pouch with 4:1 lidocaine: depomedrol under ultrasound guidance. Needle was visualized in the suprapatellar pouch. Patient tolerated the procedure well without immediate complications. ?Performed under direct supervision and guidance of Dr Juluis Rainier ?  ?Assessment & Plan:  ?1. Left knee degenerative joint disease ?Patient presented with three weeks of worsening left knee pain mostly along the lateral tendon of biceps femoris that has been resulting in  difficulty walking. On examination, did have an effusion in the suprapatellar region that was confirmed with ultrasound. She was noted to have a spur at the lateral knee resulting in irritation of the lateral bicep femoris tendon. Patient was given steroid injection in office. She is recommended for water aerobics and continued range of motion exercises of her knee. Continued supportive care with ice therapy and NSAIDs as needed. Will have her follow up in 3 months.  ? ?I observed and examined the patient with the resident and agree with assessment and plan.  Note reviewed and modified by me. ?Sterling Big, MD ?

## 2021-08-04 NOTE — Progress Notes (Deleted)
p 

## 2021-11-10 ENCOUNTER — Ambulatory Visit (INDEPENDENT_AMBULATORY_CARE_PROVIDER_SITE_OTHER): Payer: Medicare Other | Admitting: Sports Medicine

## 2021-11-10 DIAGNOSIS — M1712 Unilateral primary osteoarthritis, left knee: Secondary | ICD-10-CM

## 2021-11-10 MED ORDER — METHYLPREDNISOLONE ACETATE 40 MG/ML IJ SUSP
40.0000 mg | Freq: Once | INTRAMUSCULAR | Status: AC
  Administered 2021-11-10: 40 mg via INTRA_ARTICULAR

## 2021-11-10 NOTE — Assessment & Plan Note (Signed)
I suggested that she keep up some baseline exercises for her hips and quadriceps After injection today she should take 3 to 4 days of limited activity and then resume her normal patterns We are hopeful she will get another prolonged relief from the injection but she was advised that this is not always consistent She will see me again in approximately 3 months

## 2021-11-10 NOTE — Progress Notes (Unsigned)
Chief complaint follow-up of left knee arthritis  Patient had a steroid injection into the suprapatellar pouch of her left knee in March.  She got great relief from this and has been able to stay quite active.  In the past week to 10 days her pain has returned.  She is getting ready to do some travel as her gram son is getting married. She is having some night pain She is having more pain with walking She would like to try another steroid injection if possible  Physical exam Pleasant female in no acute distress BP 116/72   Ht 5' 1.5" (1.562 m)   Wt 128 lb (58.1 kg)   BMI 23.79 kg/m   Left knee shows a suprapatellar pouch swelling suggestive of effusion There is some mild tenderness along the joint lines She does have good flexion and extension There is no redness There is mild warmth Ligaments are stable  Procedure:  Injection of left knee Consent obtained and verified. Time-out conducted. Noted no overlying erythema, induration, or other signs of local infection. Skin prepped in a sterile fashion On ultrasound I was able to identify suprapatellar pouch effusion that migrates more under the vastus lateralis.  On a lateral approach I was able to visualize injection of steroid into the suprapatellar pouch without difficulty. Topical analgesic spray: Ethyl chloride. Sterile ultrasound gel was used 25-gauge 1-1/2 needle Meds: 4 cc of 1% Xylocaine and 1 cc of Solu-Medrol Advised to call if fevers/chills, erythema, induration, drainage, or persistent bleeding.

## 2023-04-25 IMAGING — MG MM DIGITAL SCREENING BILAT W/ TOMO AND CAD
8 series · 8 of 24 positions shown · non-contrast
Comparison: Previous exam(s).

CLINICAL DATA: Screening.

EXAM:
DIGITAL SCREENING BILATERAL MAMMOGRAM WITH TOMOSYNTHESIS AND CAD
TECHNIQUE: Bilateral screening digital craniocaudal and mediolateral oblique
mammograms were obtained. Bilateral screening digital breast
tomosynthesis was performed. The images were evaluated with
computer-aided detection.

[R MLO synth-2D]
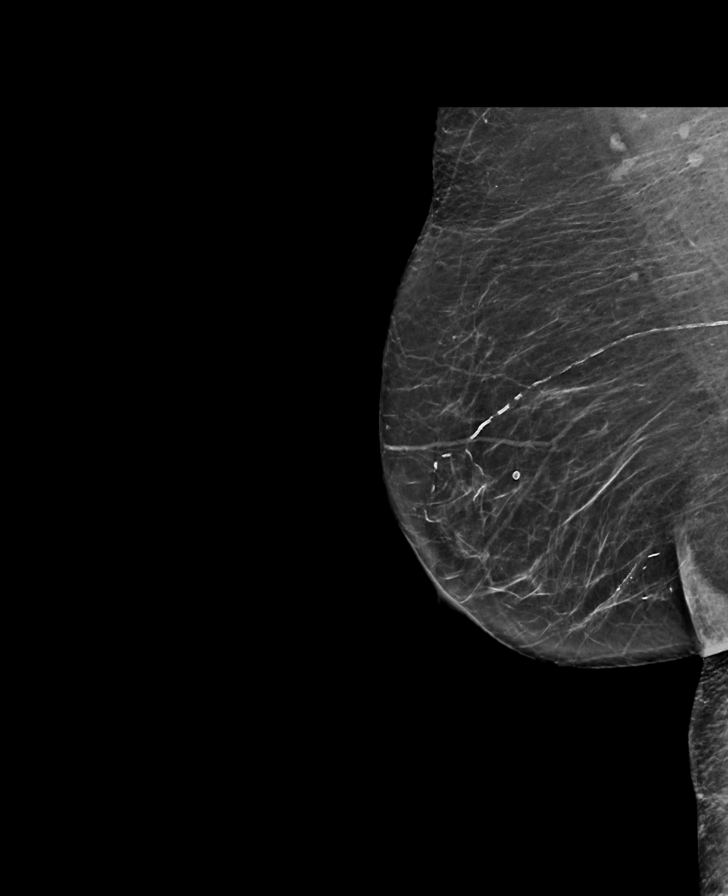

[R CC synth-2D]
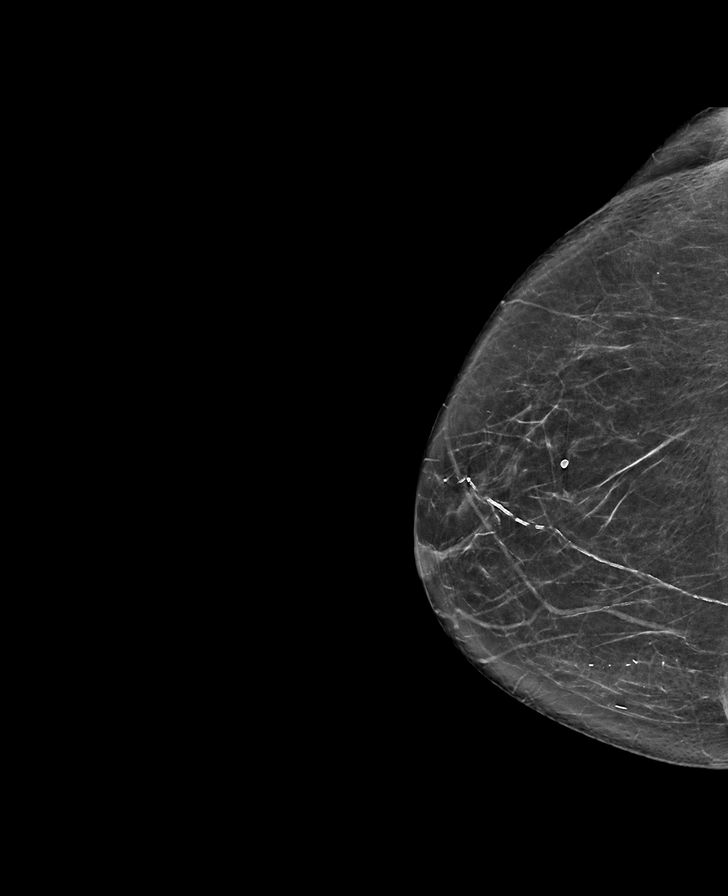

[L CC synth-2D]
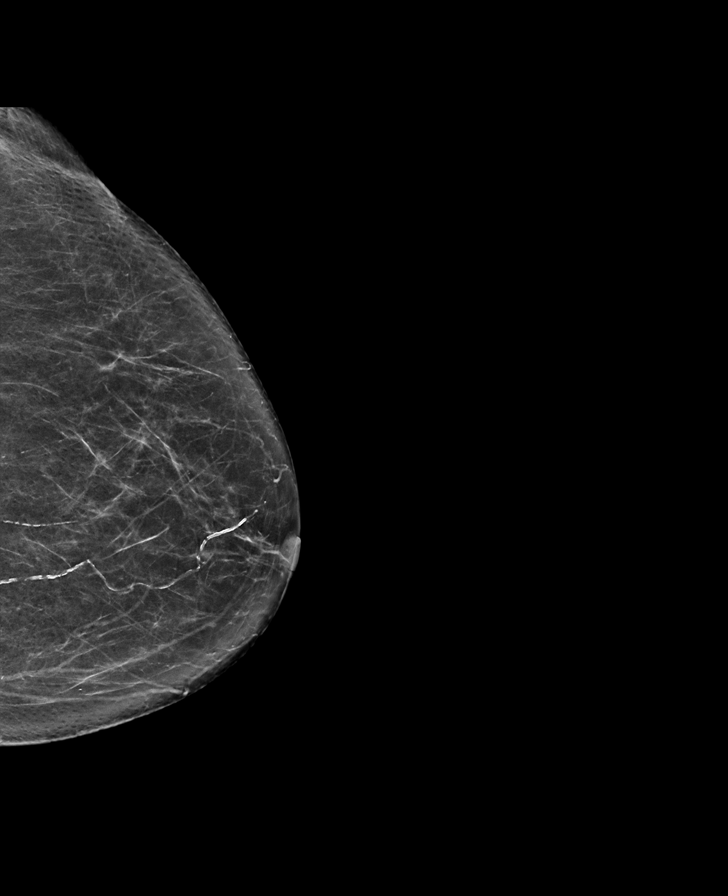

[L MLO synth-2D]
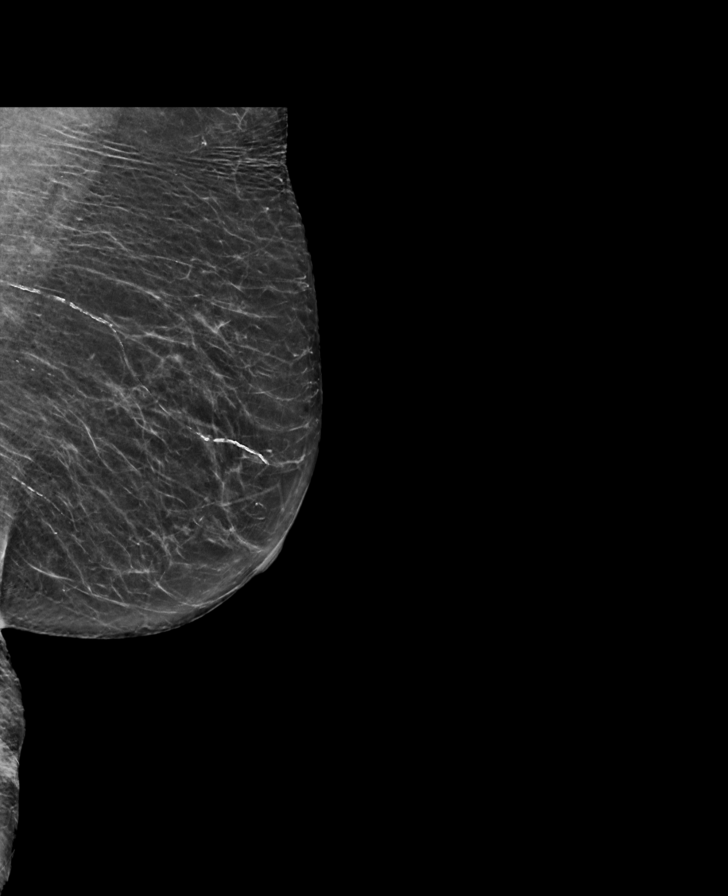

[R CC tomo · tomo slice 33/66.0]
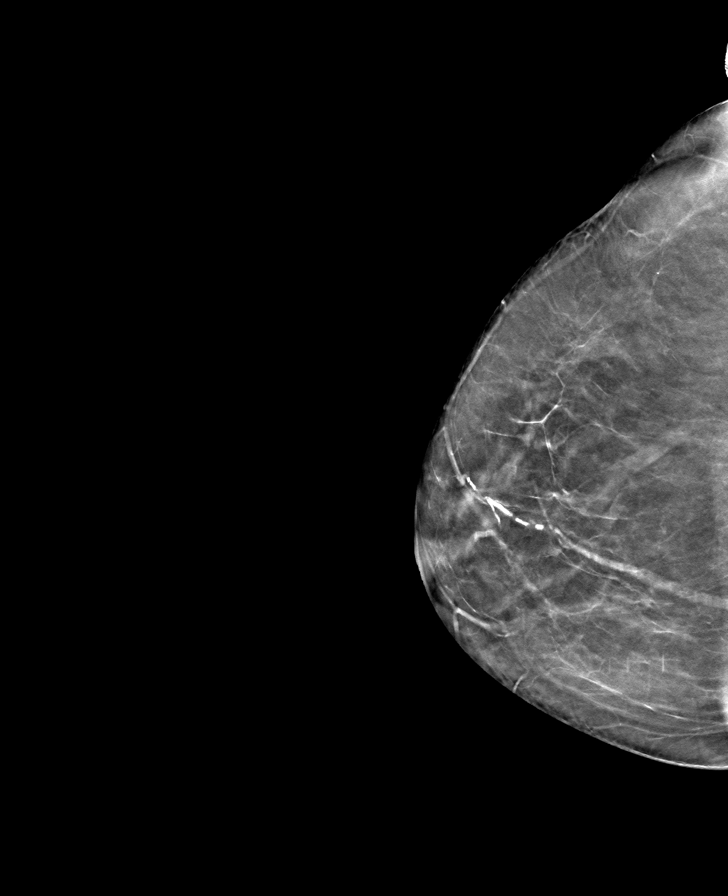

[L CC tomo · tomo slice 35/69.0]
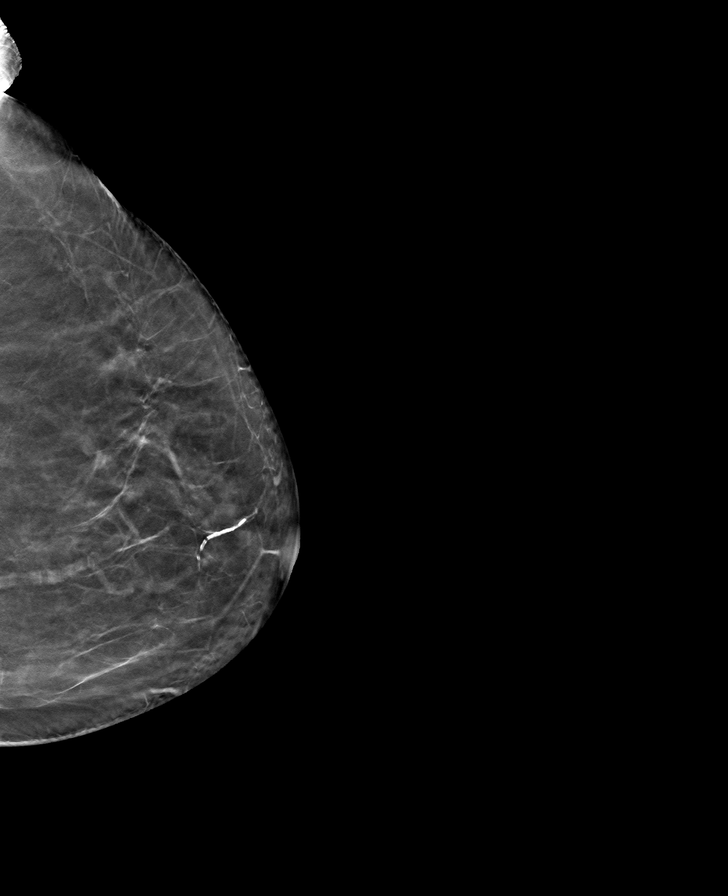

[L MLO tomo · tomo slice 32/63.0]
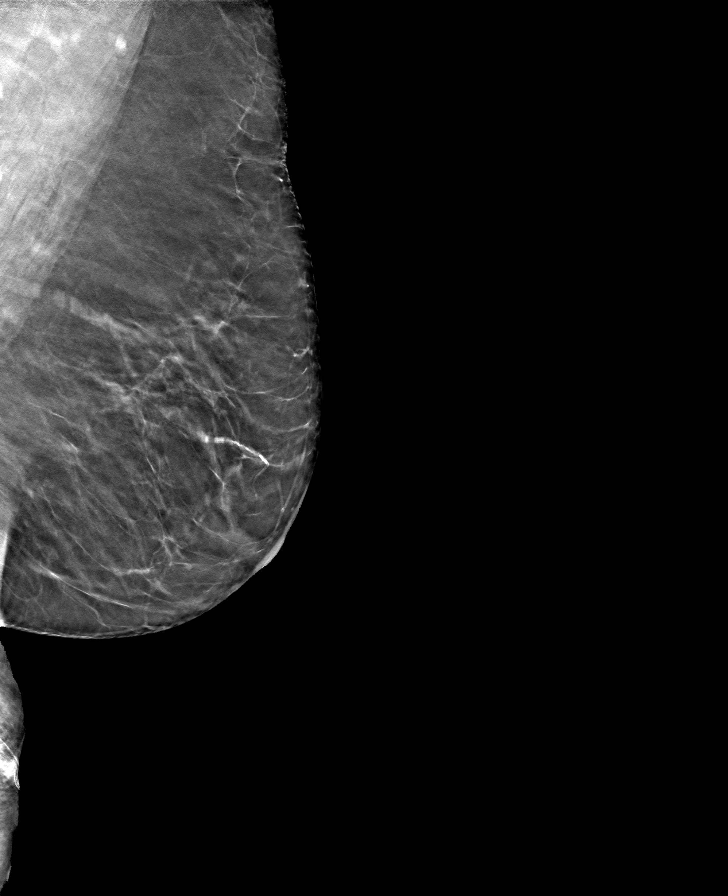

[R MLO tomo · tomo slice 34/67.0]
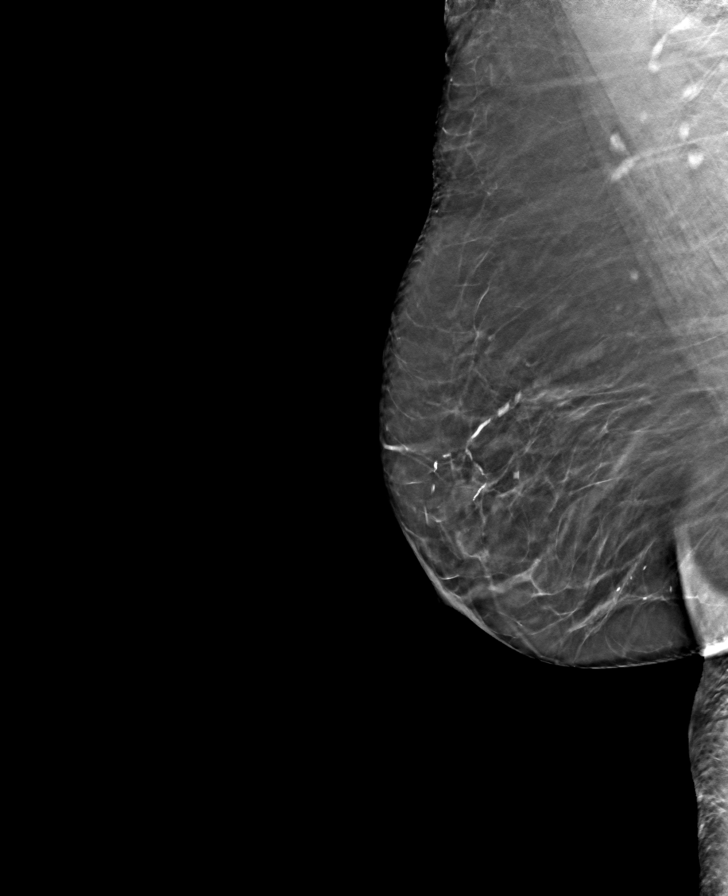

[8 of 24 positions shown; findings below may reference images not displayed]

ACR Breast Density Category b: There are scattered areas of
fibroglandular density.
FINDINGS: There are no findings suspicious for malignancy.
IMPRESSION: No mammographic evidence of malignancy. A result letter of this
screening mammogram will be mailed directly to the patient.

RECOMMENDATION:
Screening mammogram in one year. (Code:51-O-LD2)

BI-RADS CATEGORY  1: Negative.

## 2024-04-08 ENCOUNTER — Ambulatory Visit: Admitting: Sports Medicine

## 2024-04-08 ENCOUNTER — Other Ambulatory Visit: Payer: Self-pay

## 2024-04-08 VITALS — BP 116/78 | Ht 61.5 in | Wt 130.0 lb

## 2024-04-08 DIAGNOSIS — M1712 Unilateral primary osteoarthritis, left knee: Secondary | ICD-10-CM

## 2024-04-08 NOTE — Assessment & Plan Note (Signed)
 Patient has done well with home exercises and compression She is showing some worsening of function on the left I think because of the increasing genu valgus on walking gait Since she is pretty well-controlled we will continue with conservative care We had added hip abduction exercises to give her a total of 5 exercises: Straight leg raise, short arc quads, isometric quads, hip abduction and lateral walking I suggested using more Voltaren cream on the medial joint line on the left as this is where her pain seems to originate Use compression for walking or hiking  I reassured her that the knee does not look like it is close to needing replacement  She will return to see me as needed

## 2024-04-08 NOTE — Progress Notes (Signed)
 Complaint left knee pain  Patient has had known arthritis of her left knee and probably both knees for a number of years I started her using compression sleeves and doing straight leg raises and short leg extensions on her left knee a few years ago She has done well with this Now she is noticing more pain with gardening when she has the knee bent for a while She has difficulty walking down hills and down steps without some pain Over-the-counter Motrin has been enough to control her pain when needed She does not get any mechanical giving way or locking She has Voltaren gel but does not use it often  She primarily is using her knee compression sleeves when she is going on walks or hikes  Physical exam Pleasant older female in no acute distress BP 116/78   Ht 5' 1.5 (1.562 m)   Wt 130 lb (59 kg)   BMI 24.17 kg/m   Left knee lacks about 3 to 5 degrees of full extension and has flexion of 120 degrees whereas the right knee has full extension and 140 degrees of flexion With flexion and extension she gets some crepitation along the medial aspect below the patella The left knee looks to show a bit more squaring particularly at both joint lines McMurray testing shows a lot of clicking only on the left Strength of the quadriceps and the hip abductors is good  Walking gait shows dynamic genu valgus on the left only  Ultrasound of left knee There is a small effusion noted but is actually smaller than the effusion noted to the right knee which is not painful Quadriceps and patellar tendons are intact Lateral meniscus is intact with some small joint line spurring Medial meniscus is bulging out of the joint space with a pseudo meniscal cyst noted The joint space has significant spurring and is narrowed as you go more posterior  Impression: Osteoarthritis of the left knee   Ultrasound and interpretation by Helene B. Harvey, MD

## 2024-04-10 ENCOUNTER — Other Ambulatory Visit: Payer: Self-pay | Admitting: Internal Medicine

## 2024-04-10 DIAGNOSIS — Z1231 Encounter for screening mammogram for malignant neoplasm of breast: Secondary | ICD-10-CM

## 2024-07-03 ENCOUNTER — Encounter (HOSPITAL_COMMUNITY): Payer: Self-pay | Admitting: Emergency Medicine

## 2024-07-03 ENCOUNTER — Other Ambulatory Visit: Payer: Self-pay

## 2024-07-03 ENCOUNTER — Other Ambulatory Visit (HOSPITAL_COMMUNITY): Payer: Self-pay

## 2024-07-03 ENCOUNTER — Emergency Department (HOSPITAL_COMMUNITY)

## 2024-07-03 ENCOUNTER — Observation Stay (HOSPITAL_COMMUNITY)
Admission: EM | Admit: 2024-07-03 | Discharge: 2024-07-04 | Disposition: A | Source: Ambulatory Visit | Attending: Emergency Medicine | Admitting: Emergency Medicine

## 2024-07-03 ENCOUNTER — Observation Stay (HOSPITAL_COMMUNITY)

## 2024-07-03 ENCOUNTER — Telehealth (HOSPITAL_COMMUNITY): Payer: Self-pay | Admitting: Pharmacy Technician

## 2024-07-03 DIAGNOSIS — I4892 Unspecified atrial flutter: Secondary | ICD-10-CM | POA: Diagnosis not present

## 2024-07-03 DIAGNOSIS — E785 Hyperlipidemia, unspecified: Secondary | ICD-10-CM | POA: Diagnosis not present

## 2024-07-03 DIAGNOSIS — H269 Unspecified cataract: Secondary | ICD-10-CM | POA: Diagnosis present

## 2024-07-03 LAB — CBC
HCT: 43.8 % (ref 36.0–46.0)
Hemoglobin: 14.9 g/dL (ref 12.0–15.0)
MCH: 30.7 pg (ref 26.0–34.0)
MCHC: 34 g/dL (ref 30.0–36.0)
MCV: 90.3 fL (ref 80.0–100.0)
Platelets: 272 10*3/uL (ref 150–400)
RBC: 4.85 MIL/uL (ref 3.87–5.11)
RDW: 13.7 % (ref 11.5–15.5)
WBC: 9.7 10*3/uL (ref 4.0–10.5)
nRBC: 0 % (ref 0.0–0.2)

## 2024-07-03 LAB — BASIC METABOLIC PANEL WITH GFR
Anion gap: 15 (ref 5–15)
BUN: 12 mg/dL (ref 8–23)
CO2: 18 mmol/L — ABNORMAL LOW (ref 22–32)
Calcium: 9.3 mg/dL (ref 8.9–10.3)
Chloride: 108 mmol/L (ref 98–111)
Creatinine, Ser: 0.7 mg/dL (ref 0.44–1.00)
GFR, Estimated: >60 mL/min
Glucose, Bld: 100 mg/dL — ABNORMAL HIGH (ref 70–99)
Potassium: 4.2 mmol/L (ref 3.5–5.1)
Sodium: 140 mmol/L (ref 135–145)

## 2024-07-03 LAB — URINALYSIS, ROUTINE W REFLEX MICROSCOPIC
Bilirubin Urine: NEGATIVE
Glucose, UA: NEGATIVE mg/dL
Hgb urine dipstick: NEGATIVE
Ketones, ur: NEGATIVE mg/dL
Nitrite: NEGATIVE
Protein, ur: NEGATIVE mg/dL
Specific Gravity, Urine: 1.025 (ref 1.005–1.030)
pH: 5.5 (ref 5.0–8.0)

## 2024-07-03 LAB — ECHOCARDIOGRAM COMPLETE
AR max vel: 1.71 cm2
AV Area VTI: 1.83 cm2
AV Area mean vel: 1.69 cm2
AV Mean grad: 4 mmHg
AV Peak grad: 6.8 mmHg
Ao pk vel: 1.31 m/s
Area-P 1/2: 4.08 cm2
Height: 61.5 in
MV M vel: 5.18 m/s
MV Peak grad: 107.3 mmHg
P 1/2 time: 109 ms
S' Lateral: 2.7 cm
Weight: 2080 [oz_av]

## 2024-07-03 LAB — URINALYSIS, MICROSCOPIC (REFLEX)

## 2024-07-03 LAB — MAGNESIUM: Magnesium: 2.4 mg/dL (ref 1.7–2.4)

## 2024-07-03 LAB — TSH: TSH: 3.57 u[IU]/mL (ref 0.350–4.500)

## 2024-07-03 MED ORDER — PREDNISOLONE ACETATE 1 % OP SUSP
1.0000 [drp] | Freq: Two times a day (BID) | OPHTHALMIC | Status: DC
  Administered 2024-07-04: 1 [drp] via OPHTHALMIC
  Filled 2024-07-03: qty 5

## 2024-07-03 MED ORDER — OFLOXACIN 0.3 % OP SOLN
1.0000 [drp] | Freq: Four times a day (QID) | OPHTHALMIC | Status: DC
  Filled 2024-07-03: qty 5

## 2024-07-03 MED ORDER — ONDANSETRON HCL 4 MG PO TABS: 4.0000 mg | ORAL_TABLET | Freq: Four times a day (QID) | ORAL | Status: DC | PRN

## 2024-07-03 MED ORDER — ACETAMINOPHEN 650 MG RE SUPP: 650.0000 mg | Freq: Four times a day (QID) | RECTAL | Status: DC | PRN

## 2024-07-03 MED ORDER — DILTIAZEM HCL-DEXTROSE 125-5 MG/125ML-% IV SOLN (PREMIX)
5.0000 mg/h | INTRAVENOUS | Status: DC
  Administered 2024-07-03: 2.5 mg/h via INTRAVENOUS
  Administered 2024-07-03: 5 mg/h via INTRAVENOUS
  Filled 2024-07-03: qty 125

## 2024-07-03 MED ORDER — ACETAMINOPHEN 325 MG PO TABS: 650.0000 mg | ORAL_TABLET | Freq: Four times a day (QID) | ORAL | Status: DC | PRN

## 2024-07-03 MED ORDER — DILTIAZEM LOAD VIA INFUSION
20.0000 mg | Freq: Once | INTRAVENOUS | Status: AC
  Administered 2024-07-03: 20 mg via INTRAVENOUS
  Filled 2024-07-03: qty 20

## 2024-07-03 MED ORDER — RIVAROXABAN 15 MG PO TABS
15.0000 mg | ORAL_TABLET | Freq: Every day | ORAL | Status: DC
  Administered 2024-07-03: 15 mg via ORAL
  Filled 2024-07-03: qty 1

## 2024-07-03 MED ORDER — BENZONATATE 100 MG PO CAPS: 100.0000 mg | ORAL_CAPSULE | Freq: Two times a day (BID) | ORAL | Status: DC | PRN

## 2024-07-03 MED ORDER — ALBUTEROL SULFATE (2.5 MG/3ML) 0.083% IN NEBU: 2.5000 mg | INHALATION_SOLUTION | Freq: Four times a day (QID) | RESPIRATORY_TRACT | Status: DC | PRN

## 2024-07-03 MED ORDER — ROSUVASTATIN CALCIUM 5 MG PO TABS
10.0000 mg | ORAL_TABLET | Freq: Every evening | ORAL | Status: DC
  Administered 2024-07-03: 10 mg via ORAL
  Filled 2024-07-03: qty 2

## 2024-07-03 MED ORDER — SODIUM CHLORIDE 0.9 % IV BOLUS
1000.0000 mL | Freq: Once | INTRAVENOUS | Status: AC
  Administered 2024-07-03: 1000 mL via INTRAVENOUS

## 2024-07-03 MED ORDER — TRIAMCINOLONE ACETONIDE 0.1 % EX CREA
1.0000 | TOPICAL_CREAM | Freq: Three times a day (TID) | CUTANEOUS | Status: DC | PRN
  Filled 2024-07-03: qty 15

## 2024-07-03 MED ORDER — FLUTICASONE PROPIONATE 50 MCG/ACT NA SUSP
2.0000 | Freq: Every day | NASAL | Status: DC | PRN
  Filled 2024-07-03: qty 16

## 2024-07-03 MED ORDER — ONDANSETRON HCL 4 MG/2ML IJ SOLN: 4.0000 mg | Freq: Four times a day (QID) | INTRAMUSCULAR | Status: DC | PRN

## 2024-07-03 NOTE — ED Triage Notes (Signed)
 Pt was going to have cataract surgery this AM and was noted to have abnormal pulse. EKG was perfomred and pt was noted to be in SVT with rate of 166. Pt reports no SHOB or CP.

## 2024-07-03 NOTE — Progress Notes (Signed)
 PHARMACY - ANTICOAGULATION CONSULT NOTE  Pharmacy Consult for Xarelto  Indication: atrial fibrillation  Allergies[1]  Patient Measurements: Height: 5' 1.5 (156.2 cm) Weight: 59 kg (130 lb) IBW/kg (Calculated) : 48.95 HEPARIN DW (KG): 59  Vital Signs: Temp: 97.3 F (36.3 C) (02/05 1056) Temp Source: Oral (02/05 1056) BP: 103/61 (02/05 1115) Pulse Rate: 85 (02/05 1115)  Labs: Recent Labs    07/03/24 0955  HGB 14.9  HCT 43.8  PLT 272  CREATININE 0.70    Estimated Creatinine Clearance: 44.6 mL/min (by C-G formula based on SCr of 0.7 mg/dL).   Medical History: Past Medical History:  Diagnosis Date   Seasonal allergies     Medications:  (Not in a hospital admission)  Scheduled:  Infusions:   diltiazem  (CARDIZEM ) infusion 5 mg/hr (07/03/24 1059)    Assessment: Patient presented with new onset Afib/Aflutter. Not on anticoagulation at baseline. Pharmacy consultes to dose Xarelto . Renal function seems to be at baseline and is between 15-50 mL/min.    Plan:  Xarelto  15 mg daily (renally adjusted) Will check copay, complete AVS, and provide education prior to discharge   Heather Phillips 07/03/2024,11:30 AM      [1]  Allergies Allergen Reactions   Celebrex [Celecoxib]    Elemental Sulfur    Penicillins

## 2024-07-03 NOTE — Evaluation (Signed)
 RT Evaluate and Treat Note  07/03/2024   Breathing is (select one): Same as normal    The following was found on auscultation (select multiple):  Bilateral Breath Sounds: Clear (07/03/24 1332)             Cough Assessment:      Most Recent Chest Xray:... (DG Chest Port 1 View Result Date: 07/03/2024 CLINICAL DATA:  Atrial flutter. EXAM: PORTABLE CHEST 1 VIEW COMPARISON:  None Available. FINDINGS: Patient rotated left. Moderate cardiomegaly. Mitral annular calcification. No pleural effusion or pneumothorax. Lower lung predominant interstitial thickening is moderate. IMPRESSION: Lower lung predominant moderate interstitial thickening. In this never smoker, this could represent sequelae of asthma/chronic bronchitis. An acute viral or mycoplasma pneumonia cannot be entirely excluded without priors for comparison. Cardiomegaly without congestive failure. Aortic Atherosclerosis (ICD10-I70.0). Electronically Signed   By: Rockey Kilts M.D.   On: 07/03/2024 13:20      The following medications and/or interventions were ordered/changed/discontinued as part of the Respiratory Treatment protocol: N/A has PRN Albuterol  available Q6.   Medication Changes: No Change   Airway Clearance Changes: No Change   Oxygen Therapy Changes:No Change

## 2024-07-03 NOTE — ED Notes (Signed)
 Patient states she already took her eyedrops for tonight before her family left.

## 2024-07-03 NOTE — Consult Note (Addendum)
 "  Cardiology Consultation  Patient ID: Lotta Frankenfield MRN: [id number]; DOB: 05-03-1941  Admit date: 07/03/2024 Date of Consult: 07/03/2024  PCP:  Janey Santos, MD   [facility] Providers Cardiologist:  New  Patient Profile: Kobi Aller is a 84 y.o. female with a hx of seasonal allergies, bilateral chronic knee pain in the setting of degenerative joint disease who is being seen 07/03/2024 for the evaluation of atrial flutter at the request of Dr. Claudene.  History of Present Illness: Ms. Rosch has past medical history as stated above.  She was presenting to outpatient surgery to have cataract surgery this morning, while in preop, anesthesia canceled the procedure due to an irregular heartbeat.  She was sent to her primary care, repeat EKG was noted to be abnormal and she was sent to the emergency department for further evaluation. She denies any past cardiac history.   Relevant workup while in the ER includes: Metabolic panel unremarkable, CBC normal, K4.2, mag 2.4, TSH pending. EKG shows atrial flutter with HR 154. CXR showed lower lung predominant moderate interstitial thickening, possible asthma/chronic bronchitis, acute viral/mycoplasma pneumonia, cardiomegaly, aortic atherosclerosis.  Echocardiogram ordered, not performed yet.  After speaking with the patient and her family present in the room, they agree with the history stated above.  She tells me that she was scheduled to get her cataract surgery this morning and was notified by anesthesiology that the case was canceled due to an irregular rhythm on her EKG.  She denies any symptoms presently or prior.  No palpitations, chest pain, shortness of breath, syncope, presyncope.  When discussing potential triggers for irregular rhythms, patient and her family tell me that they recently lost a grandson due to suicide within the past month.  We discussed how stress can play a part in heart health including triggering arrhythmias.  We reviewed  workup that has come back so far as well as pending workup.  She is agreeable to stay for observation and undergo echocardiogram.  BP has been low normal on IV diltiazem  even at 2.5 mg an hour, will need to monitor BP closely.  As of now she remains asymptomatic however has not gotten out of the bed yet.  Past Medical History:  Diagnosis Date   Seasonal allergies    History reviewed. No pertinent surgical history.   Home Medications:  Prior to Admission medications  Medication Sig Start Date End Date Taking? Authorizing Provider  Apoaequorin (PREVAGEN PO) Take 1 capsule by mouth daily.    [provider]  cholecalciferol (VITAMIN D) 1000 UNITS tablet Take 1,000 Units by mouth daily.    [provider]  fluticasone  (FLONASE ) 50 MCG/ACT nasal spray Place 2 sprays into both nostrils daily.    [provider]  HYDROcodone -acetaminophen  (NORCO/VICODIN) 5-325 MG per tablet Take 1-2 tablets by mouth every 6 (six) hours as needed. Patient not taking: Reported on 03/03/2015 09/19/14   Anthonette Marsh, PA-C  ibuprofen (ADVIL,MOTRIN) 200 MG tablet Take 200 mg by mouth every 6 (six) hours as needed.    [provider]  loratadine-pseudoephedrine (CLARITIN-D 24-HOUR) 10-240 MG per 24 hr tablet Take 1 tablet by mouth daily.    [provider]  montelukast (SINGULAIR) 10 MG tablet Take 10 mg by mouth at bedtime.    [provider]  vitamin C (ASCORBIC ACID) 250 MG tablet Take 250 mg by mouth daily.    [provider]   Scheduled Meds:  rivaroxaban   15 mg Oral Q supper   Continuous  Infusions:  diltiazem  (CARDIZEM ) infusion 2.5 mg/hr (07/03/24 1154)   PRN Meds: acetaminophen  **OR** acetaminophen , albuterol , ondansetron  **OR** ondansetron  (ZOFRAN ) IV Allergies:   Allergies[1]  Social History:   Social History   Socioeconomic History   Marital status: Married    Spouse name: Not on file   Number of children: Not on file   Years of education:  Not on file   Highest education level: Not on file  Occupational History   Not on file  Tobacco Use   Smoking status: Never   Smokeless tobacco: Not on file  Substance and Sexual Activity   Alcohol use: No   Drug use: No   Sexual activity: Not on file  Other Topics Concern   Not on file  Social History Narrative   Not on file   Social Drivers of Health   Tobacco Use: Unknown (07/03/2024)   Patient History    Smoking Tobacco Use: Never    Smokeless Tobacco Use: Unknown    Passive Exposure: Not on file  Financial Resource Strain: Not on file  Food Insecurity: Not on file  Transportation Needs: Not on file  Physical Activity: Not on file  Stress: Not on file  Social Connections: Not on file  Intimate Partner Violence: Not on file  Depression (EYV7-0): Not on file  Alcohol Screen: Not on file  Housing: Not on file  Utilities: Not on file  Health Literacy: Not on file    Family History:   Family History  Problem Relation Age of Onset   Breast cancer Neg Hx     ROS:  Please see the history of present illness.  All other ROS reviewed and negative.     Physical Exam/Data: Vitals:   07/03/24 1230 07/03/24 1245 07/03/24 1300 07/03/24 1345  BP: 108/85 (!) 112/93 (!) 104/58 114/64  Pulse: 83 95 (!) 133 74  Resp: (!) 24 (!) 22 19 (!) 23  Temp:      TempSrc:      SpO2: 100% 100% 100% 100%  Weight:      Height:       No intake or output data in the 24 hours ending 07/03/24 1409    07/03/2024   11:02 AM 04/08/2024    8:40 AM 11/10/2021    1:48 PM  Last 3 Weights  Weight (lbs) 130 lb 130 lb 128 lb  Weight (kg) 58.968 kg 58.968 kg 58.06 kg     Body mass index is 24.17 kg/m.   General: Pleasant female, in no acute distress, family present in the room HEENT: normal Neck: no JVD Vascular: Distal pulses 2+ bilaterally Cardiac:  normal S1, S2; irregular rhythm Lungs:  clear to auscultation bilaterally, no wheezing, rhonchi or rales  Abd: soft, nontender, no  hepatomegaly  Ext: no edema Musculoskeletal:  No deformities Skin: warm and dry  Neuro:  no focal abnormalities noted Psych:  Normal affect   Telemetry:  Telemetry was personally reviewed and demonstrates: Primarily atrial flutter, rate controlled.  Brief episodes of 2-second pauses resulting in restoration of sinus rhythm however returned back to atrial fibrillation when I was in the room    Relevant CV Studies:  Echocardiogram, 07/03/2024 Ordered, pending results   Laboratory Data: High Sensitivity Troponin:  No results for input(s): TROPONINIHS in the last 720 hours. No results for input(s): TRNPT in the last 720 hours.    Chemistry Recent Labs  Lab 07/03/24 0955  NA 140  K 4.2  CL 108  CO2 18*  GLUCOSE  100*  BUN 12  CREATININE 0.70  CALCIUM  9.3  MG 2.4  GFRNONAA >60  ANIONGAP 15    No results for input(s): PROT, ALBUMIN, AST, ALT, ALKPHOS, BILITOT in the last 168 hours. Lipids No results for input(s): CHOL, TRIG, HDL, LABVLDL, LDLCALC, CHOLHDL in the last 168 hours.  Hematology Recent Labs  Lab 07/03/24 0955  WBC 9.7  RBC 4.85  HGB 14.9  HCT 43.8  MCV 90.3  MCH 30.7  MCHC 34.0  RDW 13.7  PLT 272   Thyroid  Recent Labs  Lab 07/03/24 0955  TSH 3.570    BNPNo results for input(s): BNP, PROBNP in the last 168 hours.  DDimer No results for input(s): DDIMER in the last 168 hours.  Radiology/Studies:  DG Chest Port 1 View Result Date: 07/03/2024 CLINICAL DATA:  Atrial flutter. EXAM: PORTABLE CHEST 1 VIEW COMPARISON:  None Available. FINDINGS: Patient rotated left. Moderate cardiomegaly. Mitral annular calcification. No pleural effusion or pneumothorax. Lower lung predominant interstitial thickening is moderate. IMPRESSION: Lower lung predominant moderate interstitial thickening. In this never smoker, this could represent sequelae of asthma/chronic bronchitis. An acute viral or mycoplasma pneumonia cannot be entirely excluded  without priors for comparison. Cardiomegaly without congestive failure. Aortic Atherosclerosis (ICD10-I70.0). Electronically Signed   By: Rockey Kilts M.D.   On: 07/03/2024 13:20   Assessment and Plan:  Paroxysmal atrial flutter, new onset No prior history of atrial flutter/fib  No prior cardiac history Scheduled outpatient cataract surgery this morning Procedure canceled due to irregular rhythm on EKG Reported rates as high as 166 Telemetry consistent with rate controlled atrial flutter  Intermittently converts to sinus rhythm, most recently back in atrial flutter  Started on Xarelto  15 mg daily Started on IV diltiazem , currently at 2.5 mg/hr Electrolytes normal TSH normal We discussed pathophysiology of atrial flutter and potential triggers Reports significant amount of stress with recent loss of grandson to suicide in the past month Patient entirely asymptomatic Remains in rate controlled atrial flutter Echocardiogram ordered, pending results Continue Xarelto  15 mg daily Okay to continue on IV diltiazem  for now, be cautious with BP, given intermittent episodes of sinus rhythm hopeful that patient may self convert Otherwise, discussed outpatient DCCV after undergoing 3 to 4 weeks of uninterrupted anticoagulation, patient agreeable  Risk Assessment/Risk Scores:     CHA2DS2-VASc Score = 3   This indicates a 3.2% annual risk of stroke. The patient's score is based upon: CHF History: 0 HTN History: 0 Diabetes History: 0 Stroke History: 0 Vascular Disease History: 0 Age Score: 2 Gender Score: 1     For questions or updates, please contact [facility] Please consult [email or web address] for contact info under   Signed, Waddell [name], PA-C  07/03/2024 2:09 PM  Personally seen and examined. Agree with above.  84 year old with newly discovered paroxysmal atrial fibrillation.  While I was in the room she converted was in sinus rhythm for about 30 seconds then went back  into atrial fibrillation with heart rates between 90 and 110.  Earlier today she was going to get her cataracts removed.  2 weeks earlier she had 1 eye done successfully.  Unfortunately both she and her husband are grieving significantly after the loss of their son.  Echocardiogram showed significant mitral annular calcification and at least moderate mitral regurgitation and mild dilated left atrium.  Left ventricular ejection fraction preliminarily appears normal.  Irregularly irregular, 2/6 holosystolic murmur.  Alert and oriented x 3.  -Continue with IV diltiazem .  Blood pressure  low normal at baseline she states.  Avoid any other antihypertensives. - TSH is normal. - Starting Xarelto  15 mg a day.  Discussed stroke prevention.  Oneil Parchment, MD  -Her husband sees Dr. Sidra Kitty.  EP.  Had a generator change out recently.     [1]  Allergies Allergen Reactions   Celebrex [Celecoxib]    Elemental Sulfur    Penicillins    Tomato Hives and Rash   Vicks Vaporub [Camphor-Eucalyptus-Menthol] Rash    Burn/Rash    "

## 2024-07-03 NOTE — Telephone Encounter (Signed)
 Patient Product/process Development Scientist completed.    The patient is insured through [facility]. Patient has Medicare and is not eligible for a copay card, but may be able to apply for patient assistance or Medicare RX Payment Plan (Patient Must reach out to their plan, if eligible for payment plan), if available.    Ran test claim for Xarelto  10 mg and the current 30 day co-pay is $206.91 due to a deductible.   This test claim was processed through [facility]- copay amounts may vary at other pharmacies due to pharmacy/plan contracts, or as the patient moves through the different stages of their insurance plan.     Reyes Sharps, CPHT Pharmacy Technician Patient Advocate Specialist Lead [facility] Patient Advocate Team Direct Number: [phone]  Fax: [phone]

## 2024-07-03 NOTE — H&P (Signed)
 " History and Physical    Patient: Heather Phillips FMW:[id number] DOB: [birth year] DOA: 07/03/2024 DOS: the patient was seen and examined on 07/03/2024 PCP: Janey Santos, MD  Patient coming from: Outpatient surgery  Chief Complaint:  Chief Complaint  Patient presents with   Abnormal ECG   HPI: Heather Phillips is a 85 y.o. female with medical history significant of hyperlipidemia an seasonal allergies presents for evaluation after she was found to have an irregular heartbeat.    She was scheduled to have outpatient cataract surgery today, but during the pre-operative assessment with the anesthesiologist she was found to have an irregular heart rhythm concerning for atrial fibrillation.  She was sent to her primary care provider where she had a EKG that was also noted to be abnormal and she was sent to the emergency department for further evaluation.  She is on medication for cholesterol management. She denies any history of smoking and reports occasional alcohol use. No swelling, weight changes, palpitations, chest pain,  lightheadedness, fever, chills, shortness of breath, or recent sick contacts to her knowledge.  She has a history of cataract surgery on the right eye two weeks ago and had underwent carpal tunnel syndrome surgery on the right hand six weeks ago without issue.  In the emergency department patient was noted to be afebrile, pulse 61-115, blood pressures 94/60 - 148/82, and O2 saturation maintained on room air.  EKG revealed atrial flutter at 154 bpm.  Labs noted potassium 4.2 and magnesium 2.4.  Urinalysis noted moderate leukocytes, rare bacteria, and 0-5 WBCs.  Patient was given Cardizem  bolus 20 mg IV and then started on a Cardizem  drip.  Review of Systems: As mentioned in the history of present illness. All other systems reviewed and are negative. Past Medical History:  Diagnosis Date   Seasonal allergies    History reviewed. No pertinent surgical history. Social History:  reports  that she has never smoked. She does not have any smokeless tobacco history on file. She reports that she does not drink alcohol and does not use drugs.  Allergies[1]  Family History  Problem Relation Age of Onset   Breast cancer Neg Hx     Prior to Admission medications  Medication Sig Start Date End Date Taking? Authorizing Provider  Apoaequorin (PREVAGEN PO) Take 1 capsule by mouth daily.    [provider]  cholecalciferol (VITAMIN D) 1000 UNITS tablet Take 1,000 Units by mouth daily.    [provider]  fluticasone  (FLONASE ) 50 MCG/ACT nasal spray Place 2 sprays into both nostrils daily.    [provider]  HYDROcodone -acetaminophen  (NORCO/VICODIN) 5-325 MG per tablet Take 1-2 tablets by mouth every 6 (six) hours as needed. Patient not taking: Reported on 03/03/2015 09/19/14   Anthonette Marsh, PA-C  ibuprofen (ADVIL,MOTRIN) 200 MG tablet Take 200 mg by mouth every 6 (six) hours as needed.    [provider]  loratadine-pseudoephedrine (CLARITIN-D 24-HOUR) 10-240 MG per 24 hr tablet Take 1 tablet by mouth daily.    [provider]  montelukast (SINGULAIR) 10 MG tablet Take 10 mg by mouth at bedtime.    [provider]  vitamin C (ASCORBIC ACID) 250 MG tablet Take 250 mg by mouth daily.    [provider]    Physical Exam: Vitals:   07/03/24 1115 07/03/24 1130 07/03/24 1145 07/03/24 1200  BP: 103/61 (!) 105/57 93/66 94/62   Pulse: 85 63 61 73  Resp: (!) 22 (!) 29 (!) 21 19  Temp:  TempSrc:      SpO2: 99% 100% 97% 99%  Weight:      Height:       Constitutional: Elderly female in NAD, calm, comfortable Eyes: PERRL, lids and conjunctivae normal ENMT: Mucous membranes are moist. Posterior pharynx clear of any exudate or lesions.Normal dentition.  Neck: normal, supple Respiratory: clear to auscultation bilaterally, no wheezing, no crackles. Normal respiratory effort. No accessory muscle use.  Cardiovascular: Irregular with  soft systolic murmur appreciated. No extremity edema. 2+ pedal pulses. Abdomen: no tenderness, no masses palpated. Bowel sounds positive.  Musculoskeletal: no clubbing / cyanosis. No joint deformity upper and lower extremities. Good ROM, no contractures. Normal muscle tone.  Skin: no rashes, lesions, ulcers. No induration Neurologic: CN 2-12 grossly intact.  Strength 5/5 in all 4.  Psychiatric: Normal judgment and insight. Alert and oriented x 3. Normal mood.   Data Reviewed:  EKG revealed atrial flutter at 154 bpm with variable AV block with premature ventricular or aberrant conducted complexes. Reviewed labs, imaging, and pertinent records.  Assessment and Plan:   New onset atrial flutter Patient presents after being noted to have an irregular heart rhythm and preassessment for her cataract surgery today found to be in atrial flutter with heart rates elevated up into the 150s.  Patient was bolused a dose of Cardizem  and started on a drip.  Cardiology had been formally consulted. CHA2DS2-VASc score is equal to 3.  - Admit to a cardiac telemetry bed - Goal potassium at least 4 and magnesium at least 2.  Replace electrolytes to goal - Check TSH (3.57) - Check echocardiogram - Continue Cardizem  drip as tolerated - Xarelto  - Appreciate cardiology consultative services, will follow-up for any further recommendations.  Cataracts Patient had just recently had cataract surgery on the right eye and was due to have cataract surgery today on the left. - Will need to reschedule left eye cataract surgery in the outpatient setting - Continue eyedrops  Hyperlipidemia - Continue Crestor   DVT prophylaxis: Xarelto   Advance Care Planning:   Code Status: Full Code   Consults: Cardiology  Family Communication: Family updated at bedside  Severity of Illness: The appropriate patient status for this patient is OBSERVATION. Observation status is judged to be reasonable and necessary in order to  provide the required intensity of service to ensure the patient's safety. The patient's presenting symptoms, physical exam findings, and initial radiographic and laboratory data in the context of their medical condition is felt to place them at decreased risk for further clinical deterioration. Furthermore, it is anticipated that the patient will be medically stable for discharge from the hospital within 2 midnights of admission.   Author: Maximino DELENA Sharps, MD 07/03/2024 12:35 PM  For on call review [email or web address].      [1]  Allergies Allergen Reactions   Celebrex [Celecoxib]    Elemental Sulfur    Penicillins    "

## 2024-07-03 NOTE — ED Provider Notes (Signed)
 " [place] EMERGENCY DEPARTMENT AT [facility] Provider Note   CSN: [id number] Arrival date & time: 07/03/24  9076     Patient presents with: Abnormal ECG   Heather Phillips is a 84 y.o. female.   HPI Patient presents from outpatient surgery with concern for arrhythmia.  Patient states that she feels fine, was so prior to the procedure.  She states that she was in preop, when anesthesia canceled the procedure due to irregular heart beat.  She was sent to primary care, repeat ECG they are also abnormal and she was sent to the ED for evaluation. She denies history of arrhythmia, states that she has been doing generally well, takes no medication regularly.     Prior to Admission medications  Medication Sig Start Date End Date Taking? Authorizing Provider  Apoaequorin (PREVAGEN PO) Take 1 capsule by mouth daily.    [provider]  cholecalciferol (VITAMIN D) 1000 UNITS tablet Take 1,000 Units by mouth daily.    [provider]  fluticasone  (FLONASE ) 50 MCG/ACT nasal spray Place 2 sprays into both nostrils daily.    [provider]  HYDROcodone -acetaminophen  (NORCO/VICODIN) 5-325 MG per tablet Take 1-2 tablets by mouth every 6 (six) hours as needed. Patient not taking: Reported on 03/03/2015 09/19/14   Anthonette Marsh, PA-C  ibuprofen (ADVIL,MOTRIN) 200 MG tablet Take 200 mg by mouth every 6 (six) hours as needed.    [provider]  loratadine-pseudoephedrine (CLARITIN-D 24-HOUR) 10-240 MG per 24 hr tablet Take 1 tablet by mouth daily.    [provider]  montelukast (SINGULAIR) 10 MG tablet Take 10 mg by mouth at bedtime.    [provider]  vitamin C (ASCORBIC ACID) 250 MG tablet Take 250 mg by mouth daily.    [provider]    Allergies: Celebrex [celecoxib], Elemental sulfur, and Penicillins    Review of Systems  Updated Vital Signs BP (!) 148/82 (BP Location: Right Arm)   Pulse (!) 115   Resp 18   SpO2 100%    Physical Exam Vitals and nursing note reviewed.  Constitutional:      General: She is not in acute distress.    Appearance: She is well-developed.  HENT:     Head: Normocephalic and atraumatic.  Eyes:     Conjunctiva/sclera: Conjunctivae normal.  Cardiovascular:     Rate and Rhythm: Tachycardia present. Rhythm irregular.  Pulmonary:     Effort: Pulmonary effort is normal. No respiratory distress.     Breath sounds: Normal breath sounds. No stridor.  Abdominal:     General: There is no distension.  Skin:    General: Skin is warm and dry.  Neurological:     Mental Status: She is alert and oriented to person, place, and time.     Cranial Nerves: No cranial nerve deficit.  Psychiatric:        Mood and Affect: Mood normal.     (all labs ordered are listed, but only abnormal results are displayed) Labs Reviewed  BASIC METABOLIC PANEL WITH GFR  MAGNESIUM  CBC    EKG: None  Radiology: No results found.   Procedures   Medications Ordered in the ED  diltiazem  (CARDIZEM ) 1 mg/mL load via infusion 20 mg (has no administration in time range)    And  diltiazem  (CARDIZEM ) 125 mg in dextrose  5% 125 mL (1 mg/mL) infusion (has no administration in time range)  Medical Decision Making Adult female, generally well in appearance presents with arrhythmia, per report and on my initial exam. Patient has no history of a flutter, A-fib, has evidence for this on physical exam.  Broad differential including dehydration, infection, intrinsic arrhythmia, less likely ACS without any pain, dyspnea or other complaints. Cardiac 150s A-fib/A-flutter abnormal  Amount and/or Complexity of Data Reviewed Independent Historian:     Details: Notes External Data Reviewed: ECG.    Details: ECG from office, preop, both with SVT likely a flutter versus A-fib Labs: ordered. Decision-making details documented in ED Course. Radiology: ordered and independent  interpretation performed. Decision-making details documented in ED Course. ECG/medicine tests: ordered and independent interpretation performed. Decision-making details documented in ED Course.  Risk Prescription drug management. Decision regarding hospitalization. Diagnosis or treatment significantly limited by social determinants of health.  Patient on Cardizem  drip initial labs unremarkable   12:17 PM I discussed patient's case with her cardiology team who will follow the consulting service.  Patient's heart rate now in the 80s, though she remains A-fib.  Labs essentially noncontributory.  With no known history of onset, patient not a candidate for cardioversion, and as above, rate has been controlled with Cardizem  drip. Pharmacy consult for Xarelto  dosing placed.  Patient will be admitted to internal medicine colleagues, cardiology following, new atrial flutter.     Final diagnoses:  Atrial flutter with rapid ventricular response (HCC)    CRITICAL CARE Performed by: Lamar Salen Total critical care time: 35 minutes Critical care time was exclusive of separately billable procedures and treating other patients. Critical care was necessary to treat or prevent imminent or life-threatening deterioration. Critical care was time spent personally by me on the following activities: development of treatment plan with patient and/or surrogate as well as nursing, discussions with consultants, evaluation of patient's response to treatment, examination of patient, obtaining history from patient or surrogate, ordering and performing treatments and interventions, ordering and review of laboratory studies, ordering and review of radiographic studies, pulse oximetry and re-evaluation of patient's condition.   ED Discharge Orders          Ordered    Amb referral to AFIB Clinic        07/03/24 0952               Salen Lamar, MD 07/03/24 1218  "

## 2024-07-04 ENCOUNTER — Other Ambulatory Visit (HOSPITAL_COMMUNITY): Payer: Self-pay

## 2024-07-04 ENCOUNTER — Telehealth: Payer: Self-pay | Admitting: Cardiology

## 2024-07-04 LAB — CBC
HCT: 35.8 % — ABNORMAL LOW (ref 36.0–46.0)
Hemoglobin: 12 g/dL (ref 12.0–15.0)
MCH: 30.2 pg (ref 26.0–34.0)
MCHC: 33.5 g/dL (ref 30.0–36.0)
MCV: 90.2 fL (ref 80.0–100.0)
Platelets: 208 10*3/uL (ref 150–400)
RBC: 3.97 MIL/uL (ref 3.87–5.11)
RDW: 13.7 % (ref 11.5–15.5)
WBC: 8.3 10*3/uL (ref 4.0–10.5)
nRBC: 0 % (ref 0.0–0.2)

## 2024-07-04 LAB — BASIC METABOLIC PANEL WITH GFR
Anion gap: 11 (ref 5–15)
BUN: 15 mg/dL (ref 8–23)
CO2: 19 mmol/L — ABNORMAL LOW (ref 22–32)
Calcium: 8.6 mg/dL — ABNORMAL LOW (ref 8.9–10.3)
Chloride: 109 mmol/L (ref 98–111)
Creatinine, Ser: 0.71 mg/dL (ref 0.44–1.00)
GFR, Estimated: >60 mL/min
Glucose, Bld: 88 mg/dL (ref 70–99)
Potassium: 3.8 mmol/L (ref 3.5–5.1)
Sodium: 139 mmol/L (ref 135–145)

## 2024-07-04 MED ORDER — DILTIAZEM HCL 30 MG PO TABS
30.0000 mg | ORAL_TABLET | Freq: Four times a day (QID) | ORAL | 0 refills | Status: AC | PRN
  Filled 2024-07-04: qty 30, 8d supply, fill #0

## 2024-07-04 MED ORDER — RIVAROXABAN 15 MG PO TABS
15.0000 mg | ORAL_TABLET | Freq: Every day | ORAL | 1 refills | Status: AC
  Filled 2024-07-04: qty 30, 30d supply, fill #0

## 2024-07-04 NOTE — Telephone Encounter (Signed)
 Spoke with patient regarding medications prescribed at ER visit. Educated pt that Diltiazem  was given as needed if her HR is elevated, so she would have a way to treat it at home (reason for ED visit was aflutter with RVR) and the Xarelto  was the blood thinner to prevent her from having clots with atrial arrythmias. Husband and patient stated they weren't taking the other medication, but would only take Xarelto  and would discuss this further with Dr Jeffrie at follow-up.  Husband and patient seemed annoyed and attempted to explain this once more with them.  Will follow up in office with Dr Jeffrie for their further questions.

## 2024-07-04 NOTE — Care Management Obs Status (Signed)
 MEDICARE OBSERVATION STATUS NOTIFICATION   Patient Details  Name: Heather Phillips MRN: [id number] Date of Birth: [birth year]   Medicare Observation Status Notification Given:  Yes    Debarah Saunas, RN 07/04/2024, 9:33 AM

## 2024-07-04 NOTE — Discharge Summary (Signed)
 " Physician Discharge Summary   Patient: Heather Phillips MRN: [id number] DOB: 08-Feb-1941  Admit date:     07/03/2024  Discharge date: 07/04/24  Discharge Physician: Drue ONEIDA Potter   PCP: Avva, Ravisankar, MD   Recommendations at discharge:   Follow-up with PCP as well as cardiologist  Discharge Diagnoses: Principal Problem:   New onset atrial flutter (HCC) Active Problems:   Cataract   Hyperlipidemia  Resolved Problems:   * No resolved hospital problems. *  Hospital Course: [name] is a 84 y.o. female with medical history significant of hyperlipidemia an seasonal allergies presents for evaluation after she was found to have an irregular heartbeat.   She was scheduled to have outpatient cataract surgery today, but during the pre-operative assessment with the anesthesiologist she was found to have an irregular heart rhythm concerning for atrial fibrillation.  She was sent to her primary care provider where she had a EKG that was also noted to be abnormal and she was sent to the emergency department for further evaluation.  Patient was found to have new A-fib with RVR that converted to sinus rhythm.  Was seen by cardiologist and initiated on Xarelto  and rate control medication.  Patient has been cleared for discharge today and will follow-up as an outpatient.  Consultants: cardiology Procedures performed: Cardiology Disposition: Home Diet recommendation:  Cardiac diet DISCHARGE MEDICATION: Allergies as of 07/04/2024       Reactions   Celebrex [celecoxib]    Elemental Sulfur    Penicillins    Tomato Hives, Rash   Vicks Vaporub [camphor-eucalyptus-menthol] Rash   Burn/Rash        Medication List     STOP taking these medications    ibuprofen 200 MG tablet Commonly known as: ADVIL       TAKE these medications    B-6 PO Take 1 tablet by mouth daily.   BENZONATATE  PO Take 1-2 capsules by mouth 2 (two) times daily as needed (Cough).   cholecalciferol 1000 units  tablet Commonly known as: VITAMIN D Take 1,000 Units by mouth daily.   co-enzyme Q-10 30 MG capsule Take 100 mg by mouth daily.   CRANBERRY PO Take 1 tablet by mouth daily.   diltiazem  30 MG tablet Commonly known as: Cardizem  Take 1 tablet (30 mg total) by mouth every 6 (six) hours as needed.   fluticasone  50 MCG/ACT nasal spray Commonly known as: FLONASE  Place 2 sprays into both nostrils daily as needed for allergies or rhinitis.   loratadine-pseudoephedrine 10-240 MG 24 hr tablet Commonly known as: CLARITIN-D 24-hour Take 1 tablet by mouth daily as needed for allergies.   ofloxacin  0.3 % ophthalmic solution Commonly known as: OCUFLOX  Place 1 drop into the left eye 4 (four) times daily.   prednisoLONE  acetate 1 % ophthalmic suspension Commonly known as: PRED FORTE  Place 1 drop into the right eye 2 (two) times daily.   PROBIOTIC PO Take 1 Dose by mouth daily.   rosuvastatin  10 MG tablet Commonly known as: CRESTOR  Take 10 mg by mouth at bedtime.   triamcinolone  cream 0.1 % Commonly known as: KENALOG  Apply 1 Application topically 3 (three) times daily as needed (On back).   vitamin C 250 MG tablet Commonly known as: ASCORBIC ACID Take 250 mg by mouth daily.   Xarelto  15 MG Tabs tablet Generic drug: Rivaroxaban  Take 1 tablet (15 mg total) by mouth daily with supper.        Discharge Exam: Filed Weights   07/03/24 1102  Weight:  59 kg   Respiratory: clear to auscultation bilaterally, no wheezing, no crackles. Normal respiratory effort. No accessory muscle use.  Cardiovascular: Irregular with soft systolic murmur appreciated. No extremity edema. 2+ pedal pulses. Abdomen: no tenderness, no masses palpated. Bowel sounds positive.  Musculoskeletal: no clubbing / cyanosis. No joint deformity upper and lower extremities. Good ROM, no contractures. Normal muscle tone.  Skin: no rashes, lesions, ulcers. No induration Neurologic: CN 2-12 grossly intact.  Strength 5/5  in all 4.  Psychiatric: Normal judgment and insight. Alert and oriented x 3. Normal mood.   Condition at discharge: good  The results of significant diagnostics from this hospitalization (including imaging, microbiology, ancillary and laboratory) are listed below for reference.   Imaging Studies: ECHOCARDIOGRAM COMPLETE Result Date: 07/03/2024    ECHOCARDIOGRAM REPORT   Patient Name:   Heather Phillips Date of Exam: 07/03/2024 Medical Rec #:  [id number]   Height:       61.5 in Accession #:    [id number]  Weight:       130.0 lb Date of Birth:  12/14/1940    BSA:          1.582 m Patient Age:    83 years    BP:           99/50 mmHg Patient Gender: F           HR:           117 bpm. Exam Location:  Inpatient Procedure: 2D Echo, Cardiac Doppler and Color Doppler (Both Spectral and Color            Flow Doppler were utilized during procedure). Indications:    Atrial Flutter  History:        Patient has no prior history of Echocardiogram examinations.  Sonographer:    Philomena Daring Referring Phys: MAXIMINO DELENA SHARPS IMPRESSIONS  1. Left ventricular ejection fraction, by estimation, is 60 to 65%. Left ventricular ejection fraction by PLAX is 61 %. The left ventricle has normal function. The left ventricle has no regional wall motion abnormalities. There is mild left ventricular hypertrophy. Left ventricular diastolic function could not be evaluated.  2. Right ventricular systolic function is mildly reduced. The right ventricular size is normal. There is normal pulmonary artery systolic pressure. The estimated right ventricular systolic pressure is 26.8 mmHg.  3. Left atrial size was mildly dilated.  4. There is no evidence of pericardial effusion.  5. The mitral valve is degenerative. Moderate to severe mitral valve regurgitation. Moderate to severe mitral annular calcification.  6. The aortic valve is tricuspid. There is moderate calcification of the aortic valve. There is moderate thickening of the aortic valve. Aortic valve  regurgitation is mild. Aortic valve sclerosis/calcification is present, without any evidence of aortic stenosis.  7. The inferior vena cava is normal in size with greater than 50% respiratory variability, suggesting right atrial pressure of 3 mmHg.  8. Rhythm strip during this exam demonstrates atrial flutter. Comparison(s): No prior Echocardiogram. FINDINGS  Left Ventricle: Left ventricular ejection fraction, by estimation, is 60 to 65%. Left ventricular ejection fraction by PLAX is 61 %. The left ventricle has normal function. The left ventricle has no regional wall motion abnormalities. The left ventricular internal cavity size was normal in size. There is mild left ventricular hypertrophy. Left ventricular diastolic function could not be evaluated due to atrial flutter. Left ventricular diastolic function could not be evaluated. Right Ventricle: The right ventricular size is normal. No increase in right ventricular wall  thickness. Right ventricular systolic function is mildly reduced. There is normal pulmonary artery systolic pressure. The tricuspid regurgitant velocity is 2.44 m/s, and with an assumed right atrial pressure of 3 mmHg, the estimated right ventricular systolic pressure is 26.8 mmHg. Left Atrium: Left atrial size was mildly dilated. Right Atrium: Right atrial size was normal in size. Pericardium: There is no evidence of pericardial effusion. Mitral Valve: The mitral valve is degenerative in appearance. There is moderate thickening of the mitral valve leaflet(s). Moderate to severe mitral annular calcification. Moderate to severe mitral valve regurgitation. Tricuspid Valve: The tricuspid valve is normal in structure. Tricuspid valve regurgitation is mild. Aortic Valve: The aortic valve is tricuspid. There is moderate calcification of the aortic valve. There is moderate thickening of the aortic valve. Aortic valve regurgitation is mild. Aortic regurgitation PHT measures 109 msec. Aortic valve  sclerosis/calcification is present, without any evidence of aortic stenosis. Aortic valve mean gradient measures 4.0 mmHg. Aortic valve peak gradient measures 6.8 mmHg. Aortic valve area, by VTI measures 1.83 cm. Pulmonic Valve: The pulmonic valve was normal in structure. Pulmonic valve regurgitation is mild. No evidence of pulmonic stenosis. Aorta: The aortic root and ascending aorta are structurally normal, with no evidence of dilitation. Venous: The inferior vena cava is normal in size with greater than 50% respiratory variability, suggesting right atrial pressure of 3 mmHg. IAS/Shunts: No atrial level shunt detected by color flow Doppler. EKG: Rhythm strip during this exam demonstrates atrial flutter.  LEFT VENTRICLE PLAX 2D LV EF:         Left            Diastology                ventricular     LV e' medial:    7.29 cm/s                ejection        LV E/e' medial:  21.4                fraction by     LV e' lateral:   8.67 cm/s                PLAX is 61      LV E/e' lateral: 18.0                %. LVIDd:         4.00 cm LVIDs:         2.70 cm LV PW:         1.00 cm LV IVS:        1.20 cm LVOT diam:     1.80 cm LV SV:         42 LV SV Index:   27 LVOT Area:     2.54 cm  RIGHT VENTRICLE            IVC RV Basal diam:  2.70 cm    IVC diam: 2.00 cm RV S prime:     9.14 cm/s TAPSE (M-mode): 1.6 cm LEFT ATRIUM             Index        RIGHT ATRIUM           Index LA diam:        3.60 cm 2.28 cm/m   RA Area:     10.80 cm LA Vol (A2C):   64.1 ml 40.51 ml/m  RA Volume:   20.50  ml  12.96 ml/m LA Vol (A4C):   38.2 ml 24.14 ml/m LA Biplane Vol: 49.9 ml 31.54 ml/m  AORTIC VALVE                    PULMONIC VALVE AV Area (Vmax):    1.71 cm     PR End Diast Vel: 1.87 msec AV Area (Vmean):   1.69 cm AV Area (VTI):     1.83 cm AV Vmax:           130.50 cm/s AV Vmean:          88.400 cm/s AV VTI:            0.230 m AV Peak Grad:      6.8 mmHg AV Mean Grad:      4.0 mmHg LVOT Vmax:         87.70 cm/s LVOT Vmean:         58.700 cm/s LVOT VTI:          0.165 m LVOT/AV VTI ratio: 0.72 AI PHT:            109 msec  AORTA Ao Root diam: 2.70 cm Ao Asc diam:  2.90 cm MITRAL VALVE                TRICUSPID VALVE MV Area (PHT): 4.08 cm     TR Peak grad:   23.8 mmHg MV Decel Time: 186 msec     TR Vmax:        244.00 cm/s MR Peak grad: 107.3 mmHg MR Mean grad: 73.0 mmHg     SHUNTS MR Vmax:      518.00 cm/s   Systemic VTI:  0.16 m MR Vmean:     402.0 cm/s    Systemic Diam: 1.80 cm MV E velocity: 156.00 cm/s Vinie Maxcy MD Electronically signed by Vinie Maxcy MD Signature Date/Time: 07/03/2024/6:49:19 PM    Final    DG Chest Port 1 View Result Date: 07/03/2024 CLINICAL DATA:  Atrial flutter. EXAM: PORTABLE CHEST 1 VIEW COMPARISON:  None Available. FINDINGS: Patient rotated left. Moderate cardiomegaly. Mitral annular calcification. No pleural effusion or pneumothorax. Lower lung predominant interstitial thickening is moderate. IMPRESSION: Lower lung predominant moderate interstitial thickening. In this never smoker, this could represent sequelae of asthma/chronic bronchitis. An acute viral or mycoplasma pneumonia cannot be entirely excluded without priors for comparison. Cardiomegaly without congestive failure. Aortic Atherosclerosis (ICD10-I70.0). Electronically Signed   By: Rockey Kilts M.D.   On: 07/03/2024 13:20    Microbiology: No results found for this or any previous visit.  Labs: CBC: Recent Labs  Lab 07/03/24 0955 07/04/24 0223  WBC 9.7 8.3  HGB 14.9 12.0  HCT 43.8 35.8*  MCV 90.3 90.2  PLT 272 208   Basic Metabolic Panel: Recent Labs  Lab 07/03/24 0955 07/04/24 0223  NA 140 139  K 4.2 3.8  CL 108 109  CO2 18* 19*  GLUCOSE 100* 88  BUN 12 15  CREATININE 0.70 0.71  CALCIUM  9.3 8.6*  MG 2.4  --    Liver Function Tests: No results for input(s): AST, ALT, ALKPHOS, BILITOT, PROT, ALBUMIN in the last 168 hours. CBG: No results for input(s): GLUCAP in the last 168 hours.  Discharge time  spent:  36 minutes.  Signed: Drue ONEIDA Potter, MD [facility] 07/04/2024 "

## 2024-07-04 NOTE — Progress Notes (Signed)
 Transition of Care [facility]) - Inpatient Brief Assessment   Patient Details  Name: Verle Brillhart MRN: [id number] Date of Birth: Jul 17, 1940  Transition of Care [facility]) CM/SW Contact:    Debarah Saunas, RN Phone Number: 07/04/2024, 9:35 AM   Clinical Narrative: Inpatient Care Management (ICM) has reviewed patient at bedside and no ICM needs have been identified at this time. We will continue to monitor patient advancement through interdisciplinary progression rounds. If new patient transition needs arise, please place a ICM consult.    Transition of Care Asessment: Insurance and Status: (P) Insurance coverage has been reviewed Patient has primary care physician: (P) Yes (Avva, Ravisankar, MD) Home environment has been reviewed: (P) from home with spouse Prior level of function:: (P) independent Prior/Current Home Services: (P) No current home services   Readmission risk has been reviewed: (P) No Transition of care needs: (P) no transition of care needs at this time

## 2024-07-04 NOTE — Telephone Encounter (Signed)
 No DPR on file to speak with spouse. Left message for patient to callback.

## 2024-07-04 NOTE — Telephone Encounter (Signed)
 Pt c/o medication issue:  1. Name of Medication: Rivaroxaban  (XARELTO ) 15 MG TABS tablet   diltiazem  (CARDIZEM ) 30 MG tablet    2. How are you currently taking this medication (dosage and times per day)? N/A  3. Are you having a reaction (difficulty breathing--STAT)? no  4. What is your medication issue? Pt husband would like a c/b regarding above medications that were prescribed upon discharge from hospital. He would like to make sure these medication is ok to take. Please advise

## 2024-07-04 NOTE — Progress Notes (Addendum)
"  °  Progress Note  Patient Name: Heather Phillips Date of Encounter: 07/04/2024 [facility] Cardiologist: Oneil Parchment, MD   Interval Summary   Doing well, converted to sinus rhythm Remains in sinus, HR 70s No issues with Xarelto  at this time Eager to go home Follow-up appointment already made with our office  Vital Signs Vitals:   07/04/24 0300 07/04/24 0400 07/04/24 0605 07/04/24 0800  BP: (!) 112/50 (!) 94/53 124/68 134/65  Pulse: 66 66 70 72  Resp: 17 (!) 9 16 18   Temp:   98.1 F (36.7 C)   TempSrc:   Oral   SpO2: 100% 100% 100% 99%  Weight:      Height:       No intake or output data in the 24 hours ending 07/04/24 1002    07/03/2024   11:02 AM 04/08/2024    8:40 AM 11/10/2021    1:48 PM  Last 3 Weights  Weight (lbs) 130 lb 130 lb 128 lb  Weight (kg) 58.968 kg 58.968 kg 58.06 kg     Telemetry/ECG  Sinus rhythm, HR 70s- Personally Reviewed  Physical Exam  GEN: No acute distress.   Neck: No JVD Cardiac: RRR, no murmurs, rubs, or gallops.  Respiratory: Clear to auscultation bilaterally. GI: Soft, nontender, non-distended  MS: No edema  Assessment & Plan   Paroxysmal atrial flutter, new onset No prior history of atrial flutter/fib  No prior cardiac history Scheduled outpatient cataract surgery this morning Procedure canceled due to irregular rhythm on EKG Reported rates as high as 166 Telemetry consistent with rate controlled atrial flutter  Converted to sinus rhythm, holding  Electrolytes normal TSH normal We discussed pathophysiology of atrial flutter and potential triggers Reports significant amount of stress with recent loss of grandson to suicide in the past month Patient entirely asymptomatic Echo showed: LVEF 60 to 65%, no RWMA, mild LVH, mildly reduced RV systolic function, normal PASP, mildly dilated left atrium, no pericardial effusion, moderate to severe MR, moderate to severe MAC, mild AR, normal IVC Continue Xarelto  15 mg daily No longer  requiring diltiazem , remains in sinus with HR 70s Follow-up appointment already made with our office in March the week that her husband is going to be in [place] as well  Moderate to severe mitral regurgitation Moderate to severe MAC Mild aortic regurgitation Noted on echo this admission Discussed these findings with patient's family No prior echoes to compare Patient denies any symptoms with this, remains fairly active without symptoms Continue to monitor on serial echocardiograms as an outpatient  For questions or updates, please contact [facility] Please consult [email or web address] for contact info under   Signed, Waddell [name], PA-C   Personally seen and examined. Agree with above.  OK for DC. AFIB clinic follow up.  Xarelto  15 Dilt 30 mg PO Q 6 PRN would be nice.   Monitor mitral valve as outpatient.  Spoke to patient and family.  Oneil Parchment, MD   "

## 2024-08-20 ENCOUNTER — Ambulatory Visit: Admitting: Emergency Medicine
